# Patient Record
Sex: Male | Born: 1975 | Race: White | Hispanic: No | Marital: Single | Smoking: Former smoker
Health system: Southern US, Community
[De-identification: ages and names within clinical notes are randomized; demographics above are authoritative.]

## PROBLEM LIST (undated history)

## (undated) DIAGNOSIS — F319 Bipolar disorder, unspecified: Secondary | ICD-10-CM

## (undated) DIAGNOSIS — Z8489 Family history of other specified conditions: Secondary | ICD-10-CM

## (undated) DIAGNOSIS — I10 Essential (primary) hypertension: Secondary | ICD-10-CM

## (undated) DIAGNOSIS — F419 Anxiety disorder, unspecified: Secondary | ICD-10-CM

## (undated) HISTORY — PX: WISDOM TOOTH EXTRACTION: SHX21

## (undated) HISTORY — PX: WRIST SURGERY: SHX841

## (undated) HISTORY — PX: KNEE ARTHROSCOPY: SUR90

---

## 2003-06-07 ENCOUNTER — Emergency Department (HOSPITAL_COMMUNITY): Admission: EM | Admit: 2003-06-07 | Discharge: 2003-06-08 | Payer: Self-pay | Admitting: Emergency Medicine

## 2003-10-07 ENCOUNTER — Emergency Department (HOSPITAL_COMMUNITY): Admission: EM | Admit: 2003-10-07 | Discharge: 2003-10-08 | Payer: Self-pay | Admitting: Emergency Medicine

## 2004-08-30 ENCOUNTER — Emergency Department (HOSPITAL_COMMUNITY): Admission: EM | Admit: 2004-08-30 | Discharge: 2004-08-31 | Payer: Self-pay | Admitting: Emergency Medicine

## 2004-08-31 ENCOUNTER — Inpatient Hospital Stay (HOSPITAL_COMMUNITY): Admission: AD | Admit: 2004-08-31 | Discharge: 2004-08-31 | Payer: Self-pay | Admitting: Psychiatry

## 2004-08-31 ENCOUNTER — Ambulatory Visit: Payer: Self-pay | Admitting: Psychiatry

## 2004-09-01 ENCOUNTER — Inpatient Hospital Stay (HOSPITAL_COMMUNITY): Admission: EM | Admit: 2004-09-01 | Discharge: 2004-09-04 | Payer: Self-pay | Admitting: Emergency Medicine

## 2008-02-29 ENCOUNTER — Ambulatory Visit (HOSPITAL_BASED_OUTPATIENT_CLINIC_OR_DEPARTMENT_OTHER): Admission: RE | Admit: 2008-02-29 | Discharge: 2008-02-29 | Payer: Self-pay | Admitting: Orthopedic Surgery

## 2008-06-26 ENCOUNTER — Emergency Department (HOSPITAL_COMMUNITY): Admission: EM | Admit: 2008-06-26 | Discharge: 2008-06-26 | Payer: Self-pay | Admitting: Emergency Medicine

## 2010-06-30 ENCOUNTER — Emergency Department (HOSPITAL_COMMUNITY)
Admission: EM | Admit: 2010-06-30 | Discharge: 2010-06-30 | Disposition: A | Discharge status: Home / Self Care | Payer: Self-pay | Attending: Emergency Medicine | Admitting: Emergency Medicine

## 2010-06-30 DIAGNOSIS — I10 Essential (primary) hypertension: Secondary | ICD-10-CM | POA: Insufficient documentation

## 2010-06-30 DIAGNOSIS — M545 Low back pain, unspecified: Secondary | ICD-10-CM | POA: Insufficient documentation

## 2010-07-07 ENCOUNTER — Emergency Department (HOSPITAL_COMMUNITY)
Admission: EM | Admit: 2010-07-07 | Discharge: 2010-07-07 | Disposition: A | Discharge status: Home / Self Care | Payer: Self-pay | Attending: Emergency Medicine | Admitting: Emergency Medicine

## 2010-07-07 DIAGNOSIS — M79609 Pain in unspecified limb: Secondary | ICD-10-CM | POA: Insufficient documentation

## 2010-07-07 DIAGNOSIS — M545 Low back pain, unspecified: Secondary | ICD-10-CM | POA: Insufficient documentation

## 2010-07-07 DIAGNOSIS — I10 Essential (primary) hypertension: Secondary | ICD-10-CM | POA: Insufficient documentation

## 2010-07-07 DIAGNOSIS — M5430 Sciatica, unspecified side: Secondary | ICD-10-CM | POA: Insufficient documentation

## 2010-08-31 NOTE — Op Note (Signed)
Brady, [name]               ACCOUNT NO.:  [id number]   MEDICAL RECORD NO.:  [id number]          PATIENT TYPE:  AMB   LOCATION:  DSC                          FACILITY:  [facility]   PHYSICIAN:  Cindee Salt, M.D.       DATE OF BIRTH:  1975-08-14   DATE OF PROCEDURE:  02/29/2008  DATE OF DISCHARGE:                               OPERATIVE REPORT   PREOPERATIVE DIAGNOSIS:  Ulnocarpal abutment, right wrist.   POSTOPERATIVE DIAGNOSIS:  Ulnocarpal abutment, right wrist.   OPERATION:  Arthroscopy, right wrist with debridement of triangular  fibrocartilage complex tear, Feldman arthroplasty.   SURGEON:  Cindee Salt, MD   ASSISTANT:  Joaquin Courts, RN   ANESTHESIA:  General.   HISTORY:  The patient is a 35 year old male with a history of ulnar-  sided wrist pain, especially with twisting.  He has had an MRI done  revealing a TFCC tear, changes in his proximal ulnar lunate indicative  of an ulnocarpal abutment with a ulnar positive.  He has elected to  proceed with arthroscopy, debridement, Gibson Ramp arthroplasty, and  preference to a formal ulnar shortening osteotomy.  Questions have been  encouraged and answered.  He is aware of risks and complications  including incomplete relief of symptoms, possibility of infection,  recurrence of injury to arteries, nerves, tendons, dystrophy, and  possibility of requiring further surgical intervention, specifically  with an ulnar shortening osteotomy.  In the preoperative area, the  patient is seen, the extremity marked by both the patient and surgeon,  and antibiotic given.   PROCEDURE IN DETAIL:  The patient was brought to the operating room  where a general anesthetic was carried out without difficulty under the  direction of Dr. Gelene Mink.  He was prepped using DuraPrep, supine  position, right arm free.  A time-out was taken.  He was placed in the  ARC arthroscopy tower and 10 pounds of traction applied.  The joint was  inflated through the 3-4  portal.  A transverse incision was made through  skin only, deepened with a hemostat.  Blunt trocar was used to enter the  joint.  The joint was inspected.  The volar radial wrist ligaments were  intact.  Scapholunate ligament was intact.  The articular surface of the  distal radius, proximal carpal row was intact.  Lunotriquetral joint  showed no changes.  A central tear of the TFCC was noted.  This was  elliptical in nature with protuberance of the ulnar head with areas of  chondromalacia.  A irrigation catheter was placed in 6U, and 4-5 portal  was opened after localization with a 22 gauge needle.  A transverse  incision was made, deepened with a hemostat.  Blunt trocar was used to  enter the joint.  A full radius shaver was then introduced.  A  debridement of the triangle fibrocartilage complex was performed along  with further debridement with an Merton Border wand.  The articular cartilage  was removed from the distal ulna.  A bur was then used to bur the distal  ulna down to a level proximal to the ulnar  aspect of the distal radius.  This was done with full pronation and supination.  Image intensification  confirmed that the [facility] arthroplasty was adequate to relieve any  pressure with it being approximately a millimeter short off the distal  radius.  The midcarpal joint was then inflated through the radial  midcarpal portal.  A transverse incision was made, deepened with  hemostat.  Blunt trocar was used to enter the joint.  The joint was  inspected.  The STT joint showed no changes.  There were no changes in  the scapholunate ligament complex.  The ulnar side showed a type 2  lunate with no changes on the proximal aspect of the hamate.  The  articular surface of the midcarpal joint showed no changes.  The  arthroscope was removed.  The portals were then closed with interrupted  5-0 Vicryl Rapide sutures.  Sterile compressive dressing and volar  splint was applied.  The patient  tolerated the procedure well and was  taken to the recovery room for observation in satisfactory condition.  He will be discharged home to return to the [facility] [place] in  1 week on Vicodin.           ______________________________  Cindee Salt, M.D.     GK/MEDQ  D:  02/29/2008  T:  03/01/2008  Job:  [id number]   cc:   [facility]

## 2010-09-03 NOTE — Discharge Summary (Signed)
NAMEKAVIN, WECKWERTH               ACCOUNT NO.:  [id number]   MEDICAL RECORD NO.:  [id number]          PATIENT TYPE:  INP   LOCATION:  0368                         FACILITY:  [facility]   PHYSICIAN:  Jonna L. Robb Matar, M.D.DATE OF BIRTH:  05/08/1975   DATE OF ADMISSION:  08/31/2004  DATE OF DISCHARGE:  09/04/2004                                 DISCHARGE SUMMARY   FINAL DIAGNOSES:  1. Delirium tremens.  2. Psychosis.  3. Tobacco abuse  4. Hypertension.     PRIMARY CARE PHYSICIAN:  Unassigned.   CONSULTANTS:  Antonietta Breach, M.D., from psychiatry.   ALLERGIES:  None.   CODE STATUS:  Full.   HISTORY:  This 35 year old chronic alcohol was found to be hallucinating.  Eventually, the patient was taken into custody by police for taking the  parent's car without permission. The patient was taken to mental health, was  cleared and sent back to [facility]; however, over there, the patient  developed significant delirium, apparently had taken Flexeril and probably  some unknown other pills. The patient was transferred back to [facility] for admission.   Physical exam was unremarkable other than a slightly high blood pressure of  143/101. He was confused. Laboratory work was unremarkable. Blood screen  positive for benzodiazepines and tricyclics. Alcohol level was less than 5.   HOSPITAL COURSE:  The patient was put on an Ativan withdrawn protocol  because he continued to have episodes of hallucinations, he was seen in  consultation by Dr. Jeanie Sewer, and the patient was also given Risperdal on a  p.r.n. basis. By May 19, the patient was lucid, oriented, was planning on  doing AA with a sponsor. The patient is discharged on no medications.      JLB/MEDQ  D:  09/04/2004  T:  09/04/2004  Job:  [id number]

## 2010-09-03 NOTE — H&P (Signed)
**Note [name] Brady** [name], [name]               ACCOUNT NO.:  [id number]   MEDICAL RECORD NO.:  [id number]          PATIENT TYPE:  INP   LOCATION:  0101                         FACILITY:  [facility]   PHYSICIAN:  Michaelyn Barter, M.D. DATE OF BIRTH:  19-Dec-1975   DATE OF ADMISSION:  08/31/2004  DATE OF DISCHARGE:                                HISTORY & PHYSICAL   This is an [facility] admitting history and physical.  The  patient's primary care physician is unassigned.   CHIEF COMPLAINT:  Alcohol withdrawal/hallucinations.   HISTORY OF PRESENT ILLNESS:  [name] Brady is a 35 year old gentleman with a  past medical history of alcoholism who, according to his father, had been  noticed to be hallucinating this past Monday morning.  The patient  complained of having mice in his bed who ate his $100 bill.  The father  stated that were no mice present.  The patient himself called the police  later in the day complaining that he was seeing women in the house that were  not actually there.  The police came to investigate; however, later left.  Following this event, the patient took his parents' car without their  permission. His parents then called the police to report this incident.  Their car was found some distance away from their home and the patient was  taken into custody by the police.  The police took the patient to the mental  health facility and his parents went to have the patient committed.  The  patient subsequently was taken to mental health at approximately 7 p.m. on  Monday.  Mental health initially sent the patient to [name] to be  medically cleared and the patient was cleared yesterday at approximately 7  a.m. and sent back to [facility].  While at [facility], however, the patient developed significant delirium and was  transferred back to [facility] for further evaluation and  treatment.  The patient's father, [name] Brady, went on to  state that the  patient had taken his mother's muscle relaxant, cyclobenzaprine.  The bottle  initially contained 30 tablets but when he found the bottle, it was empty.  Likewise, he went on to state that the patient had purchased some unknown  pills on his own.   PAST MEDICAL HISTORY:  1.  Alcoholism.  2.  Delirium tremens which was treated at [facility] March 2005.   PAST SURGICAL HISTORY:  None.   ALLERGIES:  None.   HOME MEDICATIONS:  The patient takes no prescribed medications.   SOCIAL HISTORY:  Alcohol:  The patient started drinking at the age of 35.  The patient drinks at least 750 mL of vodka per day in the evening.  In the  morning he takes uppers to help him get going in the morning.  In addition,  he takes Jolt power drink also to help him have more energy in the morning.  According to the patient's father, he has been drinking very heavily for at  least a year.   Cigarettes:  Smokes one pack of cigarettes per day  and has been doing so  since the age of 35.   Street drugs:  According to the patient's father, he has tried a little bit  of everything in his younger years.   FAMILY HISTORY:  Mother has hypothyroidism.  Father has diabetes mellitus  and hypertension.   REVIEW OF SYSTEMS:  As per HPI.  Otherwise all review of systems are  negative.   PHYSICAL EXAMINATION:  GENERAL:  The patient is currently in wrist and hand  restraints.  Initially he was asleep when I went into the room.  However,  when I awakened him, initially he appeared to be somewhat confused but then  became more oriented, following commands.  He demonstrated no other signs of  distress.  He did not appear to be tachypneic or have any signs of  respiratory distress at all.  VITAL SIGNS:  Initially when he arrived in the emergency room, his  temperature was 97.8.  His blood pressure was 143/101, heart rate 125,  respirations 20.  O2 saturation was 96%.  HEENT:  Anicteric.  Pupils are  dilated, are sluggish to light.  Uvula is  midline.  No thrush.  NECK:  Supple.  No lymphadenopathy.  Thyroid not palpable.  CARDIAC: S1 and S2 present.  Regular rate and rhythm.  No S3. No S4.  LUNGS:  Clear bilaterally.  No crackles.  No wheezes.  ABDOMEN:  Soft, nontender, nondistended.  Positive bowel sounds.  No  organomegaly.  EXTREMITIES:  No leg edema.  NEUROLOGIC:  The patient is alert and oriented x3.  Currently he does not  appear to be hallucinating.  However, the patient just woke up and my  questions are pretty focused at this particular time and also limited.  MUSCULOSKELETAL:  Good grip strength bilaterally.  The patient is otherwise  again restrained and, therefore, my musculoskeletal and neurological exam  are somewhat limited because of this.   LABORATORY DATA:  The patient's white blood cell count is 7.5, hemoglobin  15, hematocrit 43.5, platelet count 205.  Sodium 137, potassium 4, chloride  102, CO2 27, glucose 88, BUN 15, creatinine 1, bilirubin total 1.2, alk phos  93.  SGOT 55, SGPT 39. Total protein 6.8, albumin 4.1, calcium 9.7.  The  patient's drug screen was positive for benzodiazepines. Tricyclics were  positive on Aug 30 2004.  Alcohol on Aug 30, 2004 was 219.  Alcohol on Aug 31, 2004 was less than 5.   ASSESSMENT/PLAN:  1.  Delirium.  This is most likely to be secondary to alcohol withdrawal.      According to the patient's father, it has been less than 24 hours since      the patient last consumed alcohol.  Therefore, again the patient more      than likely is in the window during which withdrawal would be more      likely.  Will treat the patient symptomatically for now.  Will start      alcohol withdrawal protocol.  Will also provide Librium on a p.r.n.      basis.   1.  Alcohol withdrawal:  Again will treat per alcohol withdrawal protocol.      Will provide thiamine, multivitamins, folic acid.  Will continue     restraints p.r.n. to prevent the  patient from hurting himself.  Will      also monitor for signs of delirium tremens.  The patient has had a      history of delirium tremens.  However, time frame from delirium tremens      currently is not appropriate in that it has only been approximately 24      hours since the patient did complete his last drink according to his      father.   1.  Gastrointestinal prophylaxis:  Will provide Protonix 40 mg daily.      OR/MEDQ  D:  09/01/2004  T:  09/01/2004  Job:  [id number]

## 2010-09-03 NOTE — Discharge Summary (Signed)
NAMEABDUR, [name] Brady               ACCOUNT NO.:  [id number]   MEDICAL RECORD NO.:  [id number]          PATIENT TYPE:  IPS   LOCATION:  0406                          FACILITY:  [facility]   PHYSICIAN:  Jeanice Lim, M.D. DATE OF BIRTH:  [birth year]   DATE OF ADMISSION:  08/31/2004  DATE OF DISCHARGE:  08/31/2004                                 DISCHARGE SUMMARY   IDENTIFYING DATA:  This is a 35 year old single Caucasian male involuntarily  admitted.  Petitioned by her mother for cocaine and alcohol and drug use.  Had been seeing people dressed like seat cushions and began breaking up  furniture.  Yesterday was seen in the emergency room and returned to mental  health, dysarthric speech, coarse tremor, visual hallucinations, poor  historian, delirium and drinking large amounts of alcohol daily and having  taken pain medicines unclear.   MEDICATIONS:  No known medications prescribed.   ALLERGIES:  No known drug allergies.   PHYSICAL EXAMINATION:  Positive nystagmus with poor ocular tracking and  dysarthria and ataxia.   MENTAL STATUS EXAM:  Fully alert, pleasant and cooperative, tremulous,  dysarthric, disorganized.  Unable to answer questions.  Mood depressed no  acute distress confused.  Cognitively impaired.  Judgment and insight  impaired.   ADMISSION DIAGNOSES:   AXIS I:  1.  Substance-induced psychosis.  2.  Rule out major depressive disorder.  3.  Possible delirium.  4.  Substance-related alcohol dependence.  5.  Possible withdrawal syndrome.  6.  Polysubstance abuse.   AXIS II:  None.   AXIS III:  Delirium secondary to alcohol withdrawal.   AXIS IV:  Moderate (stressors with limited support system and sequela of  substance abuse and mood).   AXIS V:  20/55.   HOSPITAL COURSE:  The patient was admitted and ordered routine p.r.n.  medications and underwent further monitoring.  Was encouraged to participate  in individual, group and milieu therapy.  Required Ativan  when getting on  the unit and then Librium detox protocol and CIWAs for safe withdrawal and  seizure precautions.  The patient was monitored and showed nystagmus and a  coarse tremor, confusion which became worse and worse, agitated, was trying  the door, trying to get out.  Pulse was elevated at 130 to 140 and patient  had received multiple Ativan, Librium 15 and 25 and had to be transferred to  emergency room for medical admission due to degree of confusion and delirium  and need for medical stabilization.   DISCHARGE DIAGNOSES:   AXIS I:  1.  Substance-induced psychosis.  2.  Rule out major depressive disorder.  3.  Possible delirium.  4.  Substance-related alcohol dependence.  5.  Possible withdrawal syndrome.  6.  Polysubstance abuse.   AXIS II:  None.   AXIS III:  Delirium secondary to alcohol withdrawal.   AXIS IV:  Moderate (stressors with limited support system and sequela of  substance abuse and mood).   AXIS V:  Global Assessment of Functioning on discharge 35-40.       JEM/MEDQ  D:  10/05/2004  T:  10/05/2004  Job:  [id number]

## 2011-02-22 ENCOUNTER — Encounter: Payer: Self-pay | Admitting: Emergency Medicine

## 2011-02-22 ENCOUNTER — Emergency Department (HOSPITAL_COMMUNITY)
Admission: EM | Admit: 2011-02-22 | Discharge: 2011-02-22 | Disposition: A | Discharge status: Home / Self Care | Payer: Self-pay | Attending: Emergency Medicine | Admitting: Emergency Medicine

## 2011-02-22 DIAGNOSIS — M549 Dorsalgia, unspecified: Secondary | ICD-10-CM | POA: Insufficient documentation

## 2011-02-22 DIAGNOSIS — F17200 Nicotine dependence, unspecified, uncomplicated: Secondary | ICD-10-CM | POA: Insufficient documentation

## 2011-02-22 DIAGNOSIS — IMO0001 Reserved for inherently not codable concepts without codable children: Secondary | ICD-10-CM | POA: Insufficient documentation

## 2011-02-22 DIAGNOSIS — M791 Myalgia, unspecified site: Secondary | ICD-10-CM

## 2011-02-22 DIAGNOSIS — M542 Cervicalgia: Secondary | ICD-10-CM | POA: Insufficient documentation

## 2011-02-22 HISTORY — DX: Essential (primary) hypertension: I10

## 2011-02-22 MED ORDER — IBUPROFEN 800 MG PO TABS: 800.0000 mg | ORAL_TABLET | Freq: Three times a day (TID) | ORAL | Status: AC

## 2011-02-22 MED ORDER — METHOCARBAMOL 500 MG PO TABS: 500.0000 mg | ORAL_TABLET | Freq: Two times a day (BID) | ORAL | Status: AC

## 2011-02-22 NOTE — ED Provider Notes (Signed)
History     CSN: [id number] Arrival date & time: 02/22/2011 10:38 AM   First MD Initiated Contact with Patient 02/22/11 1140      Chief Complaint  Patient presents with  . Back Pain    (Consider location/radiation/quality/duration/timing/severity/associated sxs/prior treatment) Patient is a 35 y.o. male presenting with back pain. No language interpreter was used.  Back Pain  This is a new problem. The current episode started 12 to 24 hours ago. The problem occurs constantly. The problem has not changed since onset.The pain is associated with no known injury. The quality of the pain is described as aching. The pain is at a severity of 5/10. The pain is moderate. The pain is the same all the time (stareted in the left neck right neck and back). Pertinent negatives include no chest pain, no fever, no numbness, no headaches, no leg pain, no paresthesias, no paresis, no tingling and no weakness. He has tried NSAIDs for the symptoms.    Past Medical History  Diagnosis Date  . Hypertension     History reviewed. No pertinent past surgical history.  No family history on file.  History  Substance Use Topics  . Smoking status: Current Everyday Smoker    Types: Cigarettes  . Smokeless tobacco: Not on file  . Alcohol Use: Yes      Review of Systems  Constitutional: Negative for fever.  Cardiovascular: Negative for chest pain.  Musculoskeletal: Positive for back pain.  Neurological: Negative for tingling, weakness, numbness, headaches and paresthesias.  All other systems reviewed and are negative.    Allergies  Review of patient's allergies indicates no known allergies.  Home Medications   Current Outpatient Rx  Name Route Sig Dispense Refill  . UNKNOWN TO PATIENT         BP 135/90  Pulse 63  Temp(Src) 98.3 F (36.8 C) (Oral)  Resp 20  Ht 5\' 11"  (1.803 m)  Wt 183 lb (83.008 kg)  BMI 25.52 kg/m2  SpO2 99%  Physical Exam  Constitutional: He appears well-developed  and well-nourished.  Eyes: Pupils are equal, round, and reactive to light.  Cardiovascular: Normal rate.   Musculoskeletal: He exhibits tenderness.       Bilateral neck pain that raditated to Lupper back.  Woke with pain yesterday  Skin: Skin is warm and dry.  Psychiatric: He has a normal mood and affect.    ED Course  Procedures (including critical care time)  Labs Reviewed - No data to display No results found.   No diagnosis found.    MDM          Jethro Bastos, NP 02/22/11 1324  Jethro Bastos, NP 02/22/11 1331

## 2011-02-22 NOTE — ED Provider Notes (Signed)
Medical screening examination/treatment/procedure(s) were performed by non-physician practitioner and as supervising physician I was immediately available for consultation/collaboration.   Gwyneth Sprout, MD 02/22/11 [phone]

## 2011-02-22 NOTE — ED Notes (Signed)
Pt states yesterday and began having neck and back pain and both signs of the neck

## 2012-08-27 ENCOUNTER — Encounter (HOSPITAL_COMMUNITY): Payer: Self-pay

## 2012-08-27 ENCOUNTER — Inpatient Hospital Stay (HOSPITAL_COMMUNITY)
Admission: EM | Admit: 2012-08-27 | Discharge: 2012-08-30 | DRG: 751 | Disposition: A | Discharge status: Home / Self Care | Payer: BC Managed Care – PPO | Attending: Internal Medicine | Admitting: Internal Medicine

## 2012-08-27 DIAGNOSIS — R45851 Suicidal ideations: Secondary | ICD-10-CM

## 2012-08-27 DIAGNOSIS — S93409A Sprain of unspecified ligament of unspecified ankle, initial encounter: Secondary | ICD-10-CM | POA: Diagnosis present

## 2012-08-27 DIAGNOSIS — F1910 Other psychoactive substance abuse, uncomplicated: Secondary | ICD-10-CM | POA: Diagnosis present

## 2012-08-27 DIAGNOSIS — F329 Major depressive disorder, single episode, unspecified: Secondary | ICD-10-CM | POA: Diagnosis present

## 2012-08-27 DIAGNOSIS — F411 Generalized anxiety disorder: Secondary | ICD-10-CM | POA: Diagnosis present

## 2012-08-27 DIAGNOSIS — F32A Depression, unspecified: Secondary | ICD-10-CM | POA: Diagnosis present

## 2012-08-27 DIAGNOSIS — F10239 Alcohol dependence with withdrawal, unspecified: Principal | ICD-10-CM | POA: Diagnosis present

## 2012-08-27 DIAGNOSIS — F10939 Alcohol use, unspecified with withdrawal, unspecified: Principal | ICD-10-CM | POA: Diagnosis present

## 2012-08-27 DIAGNOSIS — F988 Other specified behavioral and emotional disorders with onset usually occurring in childhood and adolescence: Secondary | ICD-10-CM | POA: Diagnosis present

## 2012-08-27 DIAGNOSIS — F17200 Nicotine dependence, unspecified, uncomplicated: Secondary | ICD-10-CM | POA: Diagnosis present

## 2012-08-27 DIAGNOSIS — F10229 Alcohol dependence with intoxication, unspecified: Secondary | ICD-10-CM | POA: Diagnosis present

## 2012-08-27 DIAGNOSIS — X58XXXA Exposure to other specified factors, initial encounter: Secondary | ICD-10-CM | POA: Diagnosis present

## 2012-08-27 DIAGNOSIS — I10 Essential (primary) hypertension: Secondary | ICD-10-CM | POA: Diagnosis present

## 2012-08-27 DIAGNOSIS — F3289 Other specified depressive episodes: Secondary | ICD-10-CM | POA: Diagnosis present

## 2012-08-27 DIAGNOSIS — F1020 Alcohol dependence, uncomplicated: Secondary | ICD-10-CM | POA: Diagnosis present

## 2012-08-27 HISTORY — DX: Anxiety disorder, unspecified: F41.9

## 2012-08-27 HISTORY — DX: Family history of other specified conditions: Z84.89

## 2012-08-27 LAB — URINALYSIS, ROUTINE W REFLEX MICROSCOPIC
Glucose, UA: NEGATIVE mg/dL
Hgb urine dipstick: NEGATIVE
Ketones, ur: NEGATIVE mg/dL
Protein, ur: NEGATIVE mg/dL

## 2012-08-27 LAB — RAPID URINE DRUG SCREEN, HOSP PERFORMED
Benzodiazepines: NOT DETECTED
Cocaine: NOT DETECTED
Tetrahydrocannabinol: NOT DETECTED

## 2012-08-27 LAB — MAGNESIUM: Magnesium: 2.2 mg/dL (ref 1.5–2.5)

## 2012-08-27 LAB — COMPREHENSIVE METABOLIC PANEL
BUN: 9 mg/dL (ref 6–23)
GFR calc non Af Amer: >90 mL/min (ref >90)
Glucose, Bld: 91 mg/dL (ref 70–99)
Total Bilirubin: 0.4 mg/dL (ref 0.3–1.2)
Total Protein: 6.7 g/dL (ref 6.0–8.3)

## 2012-08-27 LAB — CBC WITH DIFFERENTIAL/PLATELET
Basophils Absolute: 0 10*3/uL (ref 0.0–0.1)
Basophils Relative: 1 % (ref 0–1)
Eosinophils Absolute: 0.1 10*3/uL (ref 0.0–0.7)
Eosinophils Relative: 1 % (ref 0–5)
HCT: 39.6 % (ref 39.0–52.0)
Hemoglobin: 13.8 g/dL (ref 13.0–17.0)
Lymphs Abs: 2.5 10*3/uL (ref 0.7–4.0)
MCH: 35 pg — ABNORMAL HIGH (ref 26.0–34.0)
MCHC: 34.8 g/dL (ref 30.0–36.0)
MCV: 100.5 fL — ABNORMAL HIGH (ref 78.0–100.0)
Neutro Abs: 3.9 10*3/uL (ref 1.7–7.7)
RDW: 13.8 % (ref 11.5–15.5)
WBC: 7.1 10*3/uL (ref 4.0–10.5)

## 2012-08-27 LAB — PHOSPHORUS: Phosphorus: 3.6 mg/dL (ref 2.3–4.6)

## 2012-08-27 LAB — ETHANOL: Alcohol, Ethyl (B): 239 mg/dL — ABNORMAL HIGH (ref 0–11)

## 2012-08-27 MED ORDER — ADULT MULTIVITAMIN W/MINERALS CH
1.0000 | ORAL_TABLET | Freq: Every day | ORAL | Status: DC
  Administered 2012-08-28 – 2012-08-30 (×4): 1 via ORAL
  Filled 2012-08-27 (×4): qty 1

## 2012-08-27 MED ORDER — SODIUM CHLORIDE 0.9 % IV BOLUS (SEPSIS)
1000.0000 mL | Freq: Once | INTRAVENOUS | Status: AC
  Administered 2012-08-27: 1000 mL via INTRAVENOUS

## 2012-08-27 MED ORDER — LORAZEPAM 2 MG/ML IJ SOLN
0.0000 mg | Freq: Four times a day (QID) | INTRAMUSCULAR | Status: AC
  Administered 2012-08-28: 2 mg via INTRAVENOUS
  Administered 2012-08-28: 1 mg via INTRAVENOUS
  Administered 2012-08-28 – 2012-08-29 (×3): 2 mg via INTRAVENOUS
  Filled 2012-08-27 (×6): qty 1

## 2012-08-27 MED ORDER — FOLIC ACID 1 MG PO TABS
1.0000 mg | ORAL_TABLET | Freq: Every day | ORAL | Status: DC
  Administered 2012-08-28 – 2012-08-30 (×4): 1 mg via ORAL
  Filled 2012-08-27 (×4): qty 1

## 2012-08-27 MED ORDER — VITAMIN B-1 100 MG PO TABS
100.0000 mg | ORAL_TABLET | Freq: Every day | ORAL | Status: DC
  Administered 2012-08-28 – 2012-08-30 (×3): 100 mg via ORAL
  Filled 2012-08-27 (×3): qty 1

## 2012-08-27 MED ORDER — THIAMINE HCL 100 MG/ML IJ SOLN
100.0000 mg | Freq: Every day | INTRAMUSCULAR | Status: DC
  Administered 2012-08-27: 100 mg via INTRAVENOUS
  Filled 2012-08-27 (×3): qty 1
  Filled 2012-08-27: qty 2

## 2012-08-27 MED ORDER — LORAZEPAM 2 MG/ML IJ SOLN
2.0000 mg | Freq: Once | INTRAMUSCULAR | Status: AC
  Administered 2012-08-27: 2 mg via INTRAVENOUS
  Filled 2012-08-27: qty 1

## 2012-08-27 MED ORDER — LORAZEPAM 2 MG/ML IJ SOLN: 1.0000 mg | Freq: Four times a day (QID) | INTRAMUSCULAR | Status: DC | PRN

## 2012-08-27 MED ORDER — LORAZEPAM 0.5 MG PO TABS: 1.0000 mg | ORAL_TABLET | Freq: Four times a day (QID) | ORAL | Status: DC | PRN

## 2012-08-27 MED ORDER — NICOTINE 21 MG/24HR TD PT24
21.0000 mg | MEDICATED_PATCH | Freq: Once | TRANSDERMAL | Status: AC
  Administered 2012-08-28: 21 mg via TRANSDERMAL
  Filled 2012-08-27: qty 1

## 2012-08-27 MED ORDER — LORAZEPAM 2 MG/ML IJ SOLN
0.0000 mg | Freq: Two times a day (BID) | INTRAMUSCULAR | Status: DC
  Administered 2012-08-29: 2 mg via INTRAVENOUS
  Filled 2012-08-27: qty 1

## 2012-08-27 NOTE — ED Provider Notes (Signed)
History  This chart was scribed for [employer]. Rubin Payor, MD by Ardeen Jourdain, ED Scribe. This patient was seen in room TR04C/TR04C and the patient's care was started at 1903.  CSN: [id number]  Arrival date & time 08/27/12  1509   First MD Initiated Contact with Patient 08/27/12 1903      Chief Complaint  Patient presents with  . detox      The history is provided by the patient. No language interpreter was used.   HPI Comments: [name] Brady is a 37 y.o. male who presents to the Emergency Department complaining of needing detox from ETOH. He states he has been treated 3-4 times with the last prolonged sobriety in 2011. He states he drinks a gallon of vodka a day. He states his last drink was at 1330. He states he has had seizures in the past from withdrawal symptoms. He states he has been hospitalized in the past for withdrawal. He states he has been an alcoholic for 20 years. He denies taking any other substances habitually. He states he takes his medications as prescribed. He states he has not been able to sleep due to the alcoholism.   Past Medical History  Diagnosis Date  . Hypertension   . Anxiety     History reviewed. No pertinent past surgical history.  History reviewed. No pertinent family history.  History  Substance Use Topics  . Smoking status: Current Every Day Smoker    Types: Cigarettes  . Smokeless tobacco: Not on file  . Alcohol Use: Yes      Review of Systems  HENT: Positive for neck pain.   Musculoskeletal: Positive for back pain and arthralgias.  All other systems reviewed and are negative.    Allergies  Review of patient's allergies indicates no known allergies.  Home Medications   Current Outpatient Rx  Name  Route  Sig  Dispense  Refill  . ALPRAZolam (XANAX) 0.25 MG tablet   Oral   Take 0.25 mg by mouth 2 (two) times daily as needed for sleep.         Marland Kitchen amphetamine-dextroamphetamine (ADDERALL XR) 30 MG 24 hr capsule   Oral   Take 30  mg by mouth every morning.         Marland Kitchen atenolol (TENORMIN) 50 MG tablet   Oral   Take 50 mg by mouth daily.         Marland Kitchen buPROPion (WELLBUTRIN SR) 150 MG 12 hr tablet   Oral   Take 150 mg by mouth 2 (two) times daily.         Marland Kitchen FLUoxetine HCl (PROZAC PO)   Oral   Take 10 mg by mouth 2 (two) times daily.            Triage Vitals: BP 108/80  Pulse 74  Temp(Src) 98.3 F (36.8 C) (Oral)  Resp 14  SpO2 97%  Physical Exam  Nursing note and vitals reviewed. Constitutional: He is oriented to person, place, and time. He appears well-developed and well-nourished. No distress.  HENT:  Head: Normocephalic and atraumatic.  Eyes: EOM are normal. Pupils are equal, round, and reactive to light.  Pupils mildly dilated.   Neck: Normal range of motion. Neck supple. No tracheal deviation present.  Cardiovascular: Normal rate, regular rhythm and normal heart sounds.  Exam reveals no gallop and no friction rub.   No murmur heard. Pulmonary/Chest: Effort normal. No respiratory distress. He has no wheezes. He has no rales. He exhibits no tenderness.  Mildly harsh   Abdominal: Soft. He exhibits no distension.  Musculoskeletal: Normal range of motion. He exhibits no edema.  Neurological: He is alert and oriented to person, place, and time.  Skin: Skin is warm and dry. He is not diaphoretic.  Psychiatric: He has a normal mood and affect. His behavior is normal.  Anxious     ED Course  Procedures (including critical care time)  7:20 PM-Discussed treatment plan which includes CBC, CMP, ETOH, urine rapid drug screen and UA with pt at bedside and pt agreed to plan.    Labs Reviewed  CBC WITH DIFFERENTIAL - Abnormal; Notable for the following:    RBC 3.94 (*)    MCV 100.5 (*)    MCH 35.0 (*)    All other components within normal limits  COMPREHENSIVE METABOLIC PANEL - Abnormal; Notable for the following:    AST 40 (*)    All other components within normal limits  ETHANOL - Abnormal;  Notable for the following:    Alcohol, Ethyl (B) 239 (*)    All other components within normal limits  URINE RAPID DRUG SCREEN (HOSP PERFORMED)  URINALYSIS, ROUTINE W REFLEX MICROSCOPIC  MAGNESIUM  PHOSPHORUS   No results found.   1. Alcohol withdrawal   2. Depression       MDM  Patient is requesting detox off alcohol. He states that he uses about a gallon of vodka a day. He last drank the way to the hospital 4 hours ago. He denies other substance abuse. He denies suicidal thoughts may be told the nurse study has had some. His mother also apparently told the nurse that she came here because he stated that he was suicidal. I would not let him be discharged now. he'll be admitted to internal medicine for treatment of potentially severe withdrawal      I personally performed the services described in this documentation, which was scribed in my presence. The recorded information has been reviewed and is accurate.     Juliet Rude. Rubin Payor, MD 08/27/12 2022

## 2012-08-27 NOTE — ED Notes (Signed)
Pt requesting detox from ETOH, pt reports drinking a 5th of Voda daily x20 years, pt reports he last drank 1330 today, states "about a 1/2 fifth." pt denies using illegal drugs, HI, or SI

## 2012-08-27 NOTE — ED Notes (Signed)
Pt did not answer when called  x2 

## 2012-08-27 NOTE — H&P (Signed)
[facility] History and Physical  Ohm Dentler ZOX:[id number] DOB: [birth year]    PCP:  None  Chief Complaint: depressed, suicidal, drinking too much.  HPI: [name] Brady is an 37 y.o. male with hx of alcoholism, polysubstance abuse, HTN, GAD, ADD, presents to the ER having increased depression, having suicidal ideation but no plan, malaise, and feeling very anxious.  Normally he takes low dose xanax BID, and drink at least 1.75 Liter of liquor a day ?!!  He denied any chest pain or shortness of breath.  Evaluation in the ER included no leukocytosis, normal Hb, normal renal Fx tests and LFTs.  His MCV is 100.5.  His alcohol level is 239, and he has negative urine drug screen.  He was felt to be in very mild withdrawal, but potentially could have more withdrawal, so hospitalist was asked to admit him instead of inpatient psych treatment at this time.  Rewiew of Systems:  Constitutional: Negative for malaise, fever and chills. No significant weight loss or weight gain Eyes: Negative for eye pain, redness and discharge, diplopia, visual changes, or flashes of light. ENMT: Negative for ear pain, hoarseness, nasal congestion, sinus pressure and sore throat. No headaches; tinnitus, drooling, or problem swallowing. Cardiovascular: Negative for chest pain, palpitations, diaphoresis, dyspnea and peripheral edema. ; No orthopnea, PND Respiratory: Negative for cough, hemoptysis, wheezing and stridor. No pleuritic chestpain. Gastrointestinal: Negative for nausea, vomiting, diarrhea, constipation, abdominal pain, melena, blood in stool, hematemesis, jaundice and rectal bleeding.    Genitourinary: Negative for frequency, dysuria, incontinence,flank pain and hematuria; Musculoskeletal: Negative for back pain and neck pain. Negative for swelling and trauma.;  Skin: . Negative for pruritus, rash, abrasions, bruising and skin lesion.; ulcerations Neuro: Negative for headache, lightheadedness and neck  stiffness. Negative for weakness, altered level of consciousness , altered mental status, extremity weakness, burning feet, involuntary movement, seizure and syncope.  Psych: negative for insomnia, tearfulness, panic attacks, hallucinations.   Past Medical History  Diagnosis Date  . Hypertension   . Anxiety     History reviewed. No pertinent past surgical history.  Medications:  HOME MEDS: Prior to Admission medications   Medication Sig Start Date End Date Taking? Authorizing Provider  ALPRAZolam (XANAX) 0.25 MG tablet Take 0.25 mg by mouth 2 (two) times daily as needed for sleep.   Yes Historical Provider, MD  amphetamine-dextroamphetamine (ADDERALL XR) 30 MG 24 hr capsule Take 30 mg by mouth every morning.   Yes Historical Provider, MD  atenolol (TENORMIN) 50 MG tablet Take 50 mg by mouth daily.   Yes Historical Provider, MD  buPROPion (WELLBUTRIN SR) 150 MG 12 hr tablet Take 150 mg by mouth 2 (two) times daily.   Yes Historical Provider, MD  FLUoxetine HCl (PROZAC PO) Take 10 mg by mouth 2 (two) times daily.    Yes Historical Provider, MD     Allergies:  No Known Allergies  Social History:   reports that he has been smoking Cigarettes.  He has been smoking about 0.00 packs per day. He does not have any smokeless tobacco history on file. He reports that  drinks alcohol. He reports that he does not use illicit drugs.  Family History: History reviewed. No pertinent family history.   Physical Exam: Filed Vitals:   08/27/12 1526  BP: 108/80  Pulse: 74  Temp: 98.3 F (36.8 C)  TempSrc: Oral  Resp: 14  SpO2: 97%   Blood pressure 108/80, pulse 74, temperature 98.3 F (36.8 C), temperature source Oral, resp. rate 14, SpO2  97.00%.  GEN:  Pleasant patient lying in the stretcher in no acute distress; cooperative with exam. PSYCH:  alert and oriented x4; does not appear anxious or depressed; affect is appropriate. HEENT: Mucous membranes pink and anicteric; PERRLA; EOM  intact; no cervical lymphadenopathy nor thyromegaly or carotid bruit; no JVD; There were no stridor. Neck is very supple. Breasts:: Not examined CHEST WALL: No tenderness CHEST: Normal respiration, clear to auscultation bilaterally.  HEART: Regular rate and rhythm.  There are no murmur, rub, or gallops.   BACK: No kyphosis or scoliosis; no CVA tenderness ABDOMEN: soft and non-tender; no masses, no organomegaly, normal abdominal bowel sounds; no pannus; no intertriginous candida. There is no rebound and no distention. Rectal Exam: Not done EXTREMITIES: No bone or joint deformity; age-appropriate arthropathy of the hands and knees; no edema; no ulcerations.  There is no calf tenderness. Genitalia: not examined PULSES: 2+ and symmetric SKIN: Normal hydration no rash or ulceration. Numerous tatoo. CNS: Cranial nerves 2-12 grossly intact no focal lateralizing neurologic deficit.  Speech is fluent; uvula elevated with phonation, facial symmetry and tongue midline. DTR are normal bilaterally, cerebella exam is intact, barbinski is negative and strengths are equaled bilaterally.  No sensory loss.   Labs on Admission:  Basic Metabolic Panel:  Recent Labs Lab 08/27/12 1534 08/27/12 1955  NA 139  --   K 3.7  --   CL 102  --   CO2 31  --   GLUCOSE 91  --   BUN 9  --   CREATININE 0.90  --   CALCIUM 9.4  --   MG  --  2.2  PHOS  --  3.6   Liver Function Tests:  Recent Labs Lab 08/27/12 1534  AST 40*  ALT 31  ALKPHOS 84  BILITOT 0.4  PROT 6.7  ALBUMIN 3.8   No results found for this basename: LIPASE, AMYLASE,  in the last 168 hours No results found for this basename: AMMONIA,  in the last 168 hours CBC:  Recent Labs Lab 08/27/12 1534  WBC 7.1  NEUTROABS 3.9  HGB 13.8  HCT 39.6  MCV 100.5*  PLT 268   Cardiac Enzymes: No results found for this basename: CKTOTAL, CKMB, CKMBINDEX, TROPONINI,  in the last 168 hours  CBG: No results found for this basename: GLUCAP,  in the  last 168 hours   Radiological Exams on Admission: No results found.  Assessment/Plan Present on Admission:  . Alcohol withdrawal . GAD (generalized anxiety disorder) . Alcoholism, chronic . Depression . HTN (hypertension) . ADD (attention deficit disorder)  PLAN:  I will admit him to telemetry for very mild withdrawal with potential need for more benzodiazepines.  Will place on CIWA protocol with ativan along MVI and IVF.  Please consult psychiatry in the am for suicidal ideation and detox facility.  I have continued his meds including betablocker, Adderal, Prozac and Welbutrin at home dose.  He is stable, full code, and will be admitted to [facility] with sitter tonight.  Thank you for allowing me to partake in the care of your nice patient.  Other plans as per orders.  Code Status: FULL Unk Lightning, MD. [facility] Pager [phone] 7pm to 7am.  08/27/2012, 10:33 PM

## 2012-08-27 NOTE — ED Notes (Signed)
Le, MD at bedside.  

## 2012-08-28 ENCOUNTER — Encounter (HOSPITAL_COMMUNITY): Payer: Self-pay | Admitting: General Practice

## 2012-08-28 DIAGNOSIS — R45851 Suicidal ideations: Secondary | ICD-10-CM

## 2012-08-28 DIAGNOSIS — F1994 Other psychoactive substance use, unspecified with psychoactive substance-induced mood disorder: Secondary | ICD-10-CM

## 2012-08-28 DIAGNOSIS — I10 Essential (primary) hypertension: Secondary | ICD-10-CM

## 2012-08-28 DIAGNOSIS — F329 Major depressive disorder, single episode, unspecified: Secondary | ICD-10-CM

## 2012-08-28 LAB — CBC
HCT: 37.1 % — ABNORMAL LOW (ref 39.0–52.0)
Hemoglobin: 13.1 g/dL (ref 13.0–17.0)
MCH: 35.5 pg — ABNORMAL HIGH (ref 26.0–34.0)
MCHC: 35.3 g/dL (ref 30.0–36.0)

## 2012-08-28 LAB — CREATININE, SERUM: GFR calc non Af Amer: >90 mL/min (ref >90)

## 2012-08-28 MED ORDER — IBUPROFEN 400 MG PO TABS
400.0000 mg | ORAL_TABLET | Freq: Four times a day (QID) | ORAL | Status: DC | PRN
  Administered 2012-08-28 (×2): 400 mg via ORAL
  Filled 2012-08-28 (×3): qty 1

## 2012-08-28 MED ORDER — DIAZEPAM 5 MG PO TABS
5.0000 mg | ORAL_TABLET | Freq: Three times a day (TID) | ORAL | Status: DC
  Administered 2012-08-28 – 2012-08-29 (×3): 5 mg via ORAL
  Filled 2012-08-28 (×3): qty 1

## 2012-08-28 MED ORDER — ATENOLOL 50 MG PO TABS
50.0000 mg | ORAL_TABLET | Freq: Every day | ORAL | Status: DC
  Administered 2012-08-28 – 2012-08-30 (×3): 50 mg via ORAL
  Filled 2012-08-28 (×3): qty 1

## 2012-08-28 MED ORDER — PNEUMOCOCCAL VAC POLYVALENT 25 MCG/0.5ML IJ INJ
0.5000 mL | INJECTION | INTRAMUSCULAR | Status: AC
  Filled 2012-08-28: qty 0.5

## 2012-08-28 MED ORDER — SODIUM CHLORIDE 0.9 % IJ SOLN
3.0000 mL | Freq: Two times a day (BID) | INTRAMUSCULAR | Status: DC
  Administered 2012-08-28 – 2012-08-29 (×5): 3 mL via INTRAVENOUS

## 2012-08-28 MED ORDER — ONDANSETRON HCL 4 MG PO TABS: 4.0000 mg | ORAL_TABLET | Freq: Four times a day (QID) | ORAL | Status: DC | PRN

## 2012-08-28 MED ORDER — BUPROPION HCL ER (SR) 150 MG PO TB12
150.0000 mg | ORAL_TABLET | Freq: Two times a day (BID) | ORAL | Status: DC
  Administered 2012-08-28 (×2): 150 mg via ORAL
  Filled 2012-08-28 (×3): qty 1

## 2012-08-28 MED ORDER — FLUOXETINE HCL 20 MG PO CAPS
40.0000 mg | ORAL_CAPSULE | Freq: Every day | ORAL | Status: DC
  Administered 2012-08-29 – 2012-08-30 (×2): 40 mg via ORAL
  Filled 2012-08-28 (×2): qty 2

## 2012-08-28 MED ORDER — FLUOXETINE HCL 20 MG PO CAPS
20.0000 mg | ORAL_CAPSULE | Freq: Every day | ORAL | Status: DC
  Administered 2012-08-28: 20 mg via ORAL
  Filled 2012-08-28: qty 1

## 2012-08-28 MED ORDER — AMPHETAMINE-DEXTROAMPHET ER 10 MG PO CP24
30.0000 mg | ORAL_CAPSULE | Freq: Every morning | ORAL | Status: DC
  Administered 2012-08-28: 30 mg via ORAL
  Filled 2012-08-28: qty 3

## 2012-08-28 MED ORDER — ENOXAPARIN SODIUM 40 MG/0.4ML ~~LOC~~ SOLN
40.0000 mg | SUBCUTANEOUS | Status: DC
  Administered 2012-08-28 – 2012-08-29 (×2): 40 mg via SUBCUTANEOUS
  Filled 2012-08-28 (×3): qty 0.4

## 2012-08-28 MED ORDER — HYDRALAZINE HCL 25 MG PO TABS
25.0000 mg | ORAL_TABLET | Freq: Four times a day (QID) | ORAL | Status: DC | PRN
  Administered 2012-08-28: 25 mg via ORAL
  Filled 2012-08-28 (×2): qty 1

## 2012-08-28 MED ORDER — ONDANSETRON HCL 4 MG/2ML IJ SOLN: 4.0000 mg | Freq: Four times a day (QID) | INTRAMUSCULAR | Status: DC | PRN

## 2012-08-28 MED ORDER — DOCUSATE SODIUM 100 MG PO CAPS
100.0000 mg | ORAL_CAPSULE | Freq: Two times a day (BID) | ORAL | Status: DC
  Administered 2012-08-28 – 2012-08-30 (×5): 100 mg via ORAL
  Filled 2012-08-28 (×7): qty 1

## 2012-08-28 NOTE — Consult Note (Signed)
Reason for Consult: depression and SI; substance abuse Referring Physician: Dr. Waneta Martins [name] Brady is an 37 y.o. male.  HPI: Patient was seen and chart reviewed. Patient stated that he was intoxicated with alcohol before he was admitted to the [facility]. Patient has long history of hx of alcoholism, polysubstance abuse, HTN, GAD, and ADD. Patient has been receiving outpatient psychiatric services from [name], NP. Patient was not a bit of any suicidal ideations intentions or plans. Patient reported he has been more depressed and anxious since that his father was diagnosed with a cancer and in and out of the hospitals. Patient stated he is the oldest of 4 siblings and closest to his parent's geographically. Patient stated he was not able to walk as much as used to work before which caused a financial problems depression anxiety and insomnia. Patient reported he started drinking alcohol to cope with his symptoms of depression and anxiety associated stress from his family had problems.patient stated that he does not take medication when he was drinking. Case discussed with alcohol along with the Wellbutrin and patient agreed to change as advised.  Patient stated he has been drinking 1/5 vodka a day and some beer. His alcohol level is 239, and he has negative urine drug screen. He was felt to be in very mild withdrawal, but potentially could have more withdrawal. Patient has a previous history of fall substance abuse detox and rehabilitation services at [facility] in 2007, 2011 and again in 2012. Patient reported he has been discussing with his mother regarding long-term inpatient substance abuse treatment program at [place], [place]. He has no history of suicidal attempt and he has no family history of suicidal attempt. He has no previous acute psychiatric hospitalization.   Mental Status Examination: Patient is awake and alert and oriented x4, dressed in hospital scrubsand  fairly groomed, has multiple colorful tattoos on both upper extremities,and maintaining good eye contact. Patient has depressed and anxious mood and his affect was at appropriate and congruent. He has normal speech but more talkative. His thought process is linear and goal directed. Patient has denied suicidal, homicidal ideations, intentions or plans. Patient has no evidence of auditory or visual hallucinations, delusions, and paranoia. Patient has fair insight judgment and impulse control.   Past Medical History  Diagnosis Date  . Hypertension   . Anxiety   . Family history of anesthesia complication     difficult for father to wake up"    Past Surgical History  Procedure Laterality Date  . Knee arthroscopy Right   . Wrist surgery    . Wisdom tooth extraction      History reviewed. No pertinent family history.  Social History:  reports that he has been smoking Cigarettes.  He has a 18 pack-year smoking history. He has never used smokeless tobacco. He reports that  drinks alcohol. He reports that he uses illicit drugs (Marijuana, Heroin, and Cocaine).  Allergies: No Known Allergies  Medications: I have reviewed the patient's current medications.  Results for orders placed during the hospital encounter of 08/27/12 (from the past 48 hour(s))  CBC WITH DIFFERENTIAL     Status: Abnormal   Collection Time    08/27/12  3:34 PM      Result Value Range   WBC 7.1  4.0 - 10.5 K/uL   RBC 3.94 (*) 4.22 - 5.81 MIL/uL   Hemoglobin 13.8  13.0 - 17.0 g/dL   HCT 16.1  09.6 - 04.5 %  MCV 100.5 (*) 78.0 - 100.0 fL   MCH 35.0 (*) 26.0 - 34.0 pg   MCHC 34.8  30.0 - 36.0 g/dL   RDW 45.4  09.8 - 11.9 %   Platelets 268  150 - 400 K/uL   Neutrophils Relative 55  43 - 77 %   Neutro Abs 3.9  1.7 - 7.7 K/uL   Lymphocytes Relative 36  12 - 46 %   Lymphs Abs 2.5  0.7 - 4.0 K/uL   Monocytes Relative 7  3 - 12 %   Monocytes Absolute 0.5  0.1 - 1.0 K/uL   Eosinophils Relative 1  0 - 5 %   Eosinophils  Absolute 0.1  0.0 - 0.7 K/uL   Basophils Relative 1  0 - 1 %   Basophils Absolute 0.0  0.0 - 0.1 K/uL  COMPREHENSIVE METABOLIC PANEL     Status: Abnormal   Collection Time    08/27/12  3:34 PM      Result Value Range   Sodium 139  135 - 145 mEq/L   Potassium 3.7  3.5 - 5.1 mEq/L   Chloride 102  96 - 112 mEq/L   CO2 31  19 - 32 mEq/L   Glucose, Bld 91  70 - 99 mg/dL   BUN 9  6 - 23 mg/dL   Creatinine, Ser 1.47  0.50 - 1.35 mg/dL   Calcium 9.4  8.4 - 82.9 mg/dL   Total Protein 6.7  6.0 - 8.3 g/dL   Albumin 3.8  3.5 - 5.2 g/dL   AST 40 (*) 0 - 37 U/L   ALT 31  0 - 53 U/L   Alkaline Phosphatase 84  39 - 117 U/L   Total Bilirubin 0.4  0.3 - 1.2 mg/dL   GFR calc non Af Amer >90  >90 mL/min   GFR calc Af Amer >90  >90 mL/min   Comment:            The eGFR has been calculated     using the CKD EPI equation.     This calculation has not been     validated in all clinical     situations.     eGFR's persistently     <90 mL/min signify     possible Chronic Kidney Disease.  ETHANOL     Status: Abnormal   Collection Time    08/27/12  3:34 PM      Result Value Range   Alcohol, Ethyl (B) 239 (*) 0 - 11 mg/dL   Comment:            LOWEST DETECTABLE LIMIT FOR     SERUM ALCOHOL IS 11 mg/dL     FOR MEDICAL PURPOSES ONLY  URINE RAPID DRUG SCREEN (HOSP PERFORMED)     Status: None   Collection Time    08/27/12  5:14 PM      Result Value Range   Opiates NONE DETECTED  NONE DETECTED   Cocaine NONE DETECTED  NONE DETECTED   Benzodiazepines NONE DETECTED  NONE DETECTED   Amphetamines NONE DETECTED  NONE DETECTED   Tetrahydrocannabinol NONE DETECTED  NONE DETECTED   Barbiturates NONE DETECTED  NONE DETECTED   Comment:            DRUG SCREEN FOR MEDICAL PURPOSES     ONLY.  IF CONFIRMATION IS NEEDED     FOR ANY PURPOSE, NOTIFY LAB     WITHIN 5 DAYS.  LOWEST DETECTABLE LIMITS     FOR URINE DRUG SCREEN     Drug Class       Cutoff (ng/mL)     Amphetamine      1000      Barbiturate      200     Benzodiazepine   200     Tricyclics       300     Opiates          300     Cocaine          300     THC              50  URINALYSIS, ROUTINE W REFLEX MICROSCOPIC     Status: None   Collection Time    08/27/12  5:14 PM      Result Value Range   Color, Urine YELLOW  YELLOW   APPearance CLEAR  CLEAR   Specific Gravity, Urine 1.006  1.005 - 1.030   pH 7.0  5.0 - 8.0   Glucose, UA NEGATIVE  NEGATIVE mg/dL   Hgb urine dipstick NEGATIVE  NEGATIVE   Bilirubin Urine NEGATIVE  NEGATIVE   Ketones, ur NEGATIVE  NEGATIVE mg/dL   Protein, ur NEGATIVE  NEGATIVE mg/dL   Urobilinogen, UA 0.2  0.0 - 1.0 mg/dL   Nitrite NEGATIVE  NEGATIVE   Leukocytes, UA NEGATIVE  NEGATIVE   Comment: MICROSCOPIC NOT DONE ON URINES WITH NEGATIVE PROTEIN, BLOOD, LEUKOCYTES, NITRITE, OR GLUCOSE <1000 mg/dL.  MAGNESIUM     Status: None   Collection Time    08/27/12  7:55 PM      Result Value Range   Magnesium 2.2  1.5 - 2.5 mg/dL  PHOSPHORUS     Status: None   Collection Time    08/27/12  7:55 PM      Result Value Range   Phosphorus 3.6  2.3 - 4.6 mg/dL  CBC     Status: Abnormal   Collection Time    08/28/12 12:59 AM      Result Value Range   WBC 6.9  4.0 - 10.5 K/uL   RBC 3.69 (*) 4.22 - 5.81 MIL/uL   Hemoglobin 13.1  13.0 - 17.0 g/dL   HCT 78.2 (*) 95.6 - 21.3 %   MCV 100.5 (*) 78.0 - 100.0 fL   MCH 35.5 (*) 26.0 - 34.0 pg   MCHC 35.3  30.0 - 36.0 g/dL   RDW 08.6  57.8 - 46.9 %   Platelets 250  150 - 400 K/uL  CREATININE, SERUM     Status: None   Collection Time    08/28/12 12:59 AM      Result Value Range   Creatinine, Ser 0.84  0.50 - 1.35 mg/dL   GFR calc non Af Amer >90  >90 mL/min   GFR calc Af Amer >90  >90 mL/min   Comment:            The eGFR has been calculated     using the CKD EPI equation.     This calculation has not been     validated in all clinical     situations.     eGFR's persistently     <90 mL/min signify     possible Chronic Kidney Disease.     No results found.  Positive for anxiety, bad mood, depression, excessive alcohol consumption and sleep disturbance Blood pressure 150/99, pulse 70, temperature 98.2 F (36.8 C), temperature source Oral,  resp. rate 16, height 5\' 11"  (1.803 m), weight 169 lb 4.8 oz (76.794 kg), SpO2 97.00%.   Assessment/Plan: Substance induced mood disorder Alcohol dependence vs withdrawal   Recommendation: Will increase Fluoxetine to 40 mg QD and discontinue Wellbutrin due to may lower seizure threshold when drinks while taking the medication.  Patient does not meet criteria for acute psychiatric hospitalization. Recommend to continue alcohol detox treatment and may discharge suicidal watch and sitte. He is contracts for safety. He and his mother have plans for long term substance abuse treatment program at [place], [place] as a aftercare plan. Appreciate psychiatric consultation and follow up as clinically needed. Referred to the social service if disposition plans.  Charrisse Masley,JANARDHAHA R. 08/28/2012, 12:31 PM

## 2012-08-28 NOTE — Progress Notes (Signed)
The patient arrived to 70.  The patient was oriented to the unit and placed on telemetry.  VS were taken and the patient was assessed.  The patient's sitter is at the bedside.  An environmental search was done with both the charge nurse and the sitter.

## 2012-08-28 NOTE — Progress Notes (Signed)
Wrong patient

## 2012-08-28 NOTE — Progress Notes (Signed)
Utilization Review Completed Marthena Whitmyer J. Sonia Bromell, RN, BSN, NCM [phone]  

## 2012-08-28 NOTE — Progress Notes (Signed)
[facility] PROGRESS NOTE  Ej Pinson YQM:[id number] DOB: Sep 01, 1975 DOA: 08/27/2012 PCP: No primary provider on file.  Assessment/Plan: Depression with suicidal ideation  sitter at bedside.  still feels depressed but denies being suicidal now. Psych consulted.   etoh abuse  reports drinking 1 gallon vodka almost daily. Reports hx of withdrawal ad being in detox. Currently has mild withdrawal symptoms and BP elevated  on CIWA. i have added scheduled valium. continue atenolol. Prn hydralazine for elevated BP   Right ankle sprain patient reports seeing Dr  Doristine Section with [facility]. Reports no fracture and an ankle brace was applied. i called the office and found he had an appointment next week. Will order NSAIDs for pain. Call ortho consult if needed  Depression and GAD  continue Wellbutrin and prozac  DVT prophylaxis   Code Status: full Family Communication: none at bedside Disposition Plan: pending psych consult   Consultants:  Psych consulted  Procedures:  none  Antibiotics:  none  HPI/Subjective: Admission H&P reviewed. C/o some pain over  right ankle.   Objective: Filed Vitals:   08/28/12 0610 08/28/12 1031 08/28/12 1400 08/28/12 1422  BP: 158/95 150/99 143/105   Pulse: 71 70 83   Temp: 98.2 F (36.8 C)   98 F (36.7 C)  TempSrc: Oral   Oral  Resp: 16   17  Height:      Weight:      SpO2: 97%   100%    Intake/Output Summary (Last 24 hours) at 08/28/12 1452 Last data filed at 08/28/12 1423  Gross per 24 hour  Intake   1063 ml  Output    500 ml  Net    563 ml   Filed Weights   08/28/12 0050  Weight: 76.794 kg (169 lb 4.8 oz)    Exam:   General: Middle aged male in NAD  HEENT: no pallor, moist oral mucosa  Chest: clear b/l, no added sounds  Abd: soft, NT, ND, BS+  Ext: warm, no edema, rt ankle brace  CNS: AAOX3, fine tremors    Data Reviewed: Basic Metabolic Panel:  Recent Labs Lab 08/27/12 1534  08/27/12 1955 08/28/12 0059  NA 139  --   --   K 3.7  --   --   CL 102  --   --   CO2 31  --   --   GLUCOSE 91  --   --   BUN 9  --   --   CREATININE 0.90  --  0.84  CALCIUM 9.4  --   --   MG  --  2.2  --   PHOS  --  3.6  --    Liver Function Tests:  Recent Labs Lab 08/27/12 1534  AST 40*  ALT 31  ALKPHOS 84  BILITOT 0.4  PROT 6.7  ALBUMIN 3.8   No results found for this basename: LIPASE, AMYLASE,  in the last 168 hours No results found for this basename: AMMONIA,  in the last 168 hours CBC:  Recent Labs Lab 08/27/12 1534 08/28/12 0059  WBC 7.1 6.9  NEUTROABS 3.9  --   HGB 13.8 13.1  HCT 39.6 37.1*  MCV 100.5* 100.5*  PLT 268 250   Cardiac Enzymes: No results found for this basename: CKTOTAL, CKMB, CKMBINDEX, TROPONINI,  in the last 168 hours BNP (last 3 results) No results found for this basename: PROBNP,  in the last 8760 hours CBG: No results found for this basename: GLUCAP,  in  the last 168 hours  No results found for this or any previous visit (from the past 240 hour(s)).   Studies: No results found.  Scheduled Meds: . amphetamine-dextroamphetamine  30 mg Oral q morning - 10a  . atenolol  50 mg Oral Daily  . buPROPion  150 mg Oral BID  . diazepam  5 mg Oral Q8H  . docusate sodium  100 mg Oral BID  . enoxaparin (LOVENOX) injection  40 mg Subcutaneous Q24H  . FLUoxetine  20 mg Oral Daily  . folic acid  1 mg Oral Daily  . LORazepam  0-4 mg Intravenous Q6H   Followed by  . [START ON 08/29/2012] LORazepam  0-4 mg Intravenous Q12H  . multivitamin with minerals  1 tablet Oral Daily  . nicotine  21 mg Transdermal Once  . [START ON 08/29/2012] pneumococcal 23 valent vaccine  0.5 mL Intramuscular Tomorrow-1000  . sodium chloride  3 mL Intravenous Q12H  . thiamine  100 mg Oral Daily   Or  . thiamine  100 mg Intravenous Daily   Continuous Infusions:     Time spent: *25 MINUTES    Eddie North  [facility] Pager [phone]. If 7PM-7AM,  please contact night-coverage at [email or web address], password [facility] 08/28/2012, 2:52 PM  LOS: 1 day

## 2012-08-28 NOTE — Progress Notes (Signed)
1600 seen by psychiatrist . With ordred . OK'd to d/c sitter. Made DR. Barnett Abu aware . With orders

## 2012-08-29 MED ORDER — OXYCODONE-ACETAMINOPHEN 5-325 MG PO TABS
1.0000 | ORAL_TABLET | ORAL | Status: DC | PRN
  Administered 2012-08-29 – 2012-08-30 (×3): 2 via ORAL
  Administered 2012-08-30: 1 via ORAL
  Filled 2012-08-29 (×5): qty 2

## 2012-08-29 MED ORDER — DIAZEPAM 2 MG PO TABS: 2.0000 mg | ORAL_TABLET | Freq: Three times a day (TID) | ORAL | Status: DC

## 2012-08-29 MED ORDER — HYDROCHLOROTHIAZIDE 25 MG PO TABS
25.0000 mg | ORAL_TABLET | Freq: Every day | ORAL | Status: DC
  Administered 2012-08-29 – 2012-08-30 (×2): 25 mg via ORAL
  Filled 2012-08-29 (×2): qty 1

## 2012-08-29 MED ORDER — NICOTINE 21 MG/24HR TD PT24
21.0000 mg | MEDICATED_PATCH | Freq: Every day | TRANSDERMAL | Status: DC
  Administered 2012-08-29 – 2012-08-30 (×2): 21 mg via TRANSDERMAL
  Filled 2012-08-29 (×2): qty 1

## 2012-08-29 MED ORDER — DIAZEPAM 5 MG PO TABS
5.0000 mg | ORAL_TABLET | Freq: Three times a day (TID) | ORAL | Status: DC
  Administered 2012-08-29 – 2012-08-30 (×3): 5 mg via ORAL
  Filled 2012-08-29 (×3): qty 1

## 2012-08-29 NOTE — Progress Notes (Signed)
[facility] PROGRESS NOTE  [name] Brady ZOX:[id number] DOB: 1975/06/26 DOA: 08/27/2012 PCP: No primary provider on file.  Assessment/Plan: Principal Problem:   Alcohol withdrawal Active Problems:   GAD (generalized anxiety disorder)   Alcoholism, chronic   Depression   Suicidal ideation   HTN (hypertension)   ADD (attention deficit disorder)    Assessment/Plan:  Depression with suicidal ideation  sitter discontinued.  still feels depressed but denies being suicidal now. Psych consulted.    etoh abuse  reports drinking 1 gallon vodka almost daily. Reports hx of withdrawal ad being in detox.  Currently has mild withdrawal symptoms and BP elevated ,started HCTZ on CIWA. Continue scheduled valium. continue atenolol. Prn hydralazine for elevated BP   Right ankle sprain  patient reports seeing Dr Doristine Section with [facility]. Reports no fracture and an ankle brace was applied. i called the office and found he had an appointment next week. Will DC  NSAIDs for pain and start percocet. Call ortho consult if needed   Depression and GAD  Will increase Fluoxetine to 40 mg QD and discontinue Wellbutrin as it may lower seizure threshold with alcohol. Patient does not meet criteria for acute psychiatric hospitalization for psychiatrist. Recommend to continue alcohol detox treatment and may discharge suicidal watch and sitter    DVT prophylaxis  Code Status: full  Family Communication: none at bedside  Disposition Plan: pending psych consult  Consultants:  Psych consulted Procedures:  none Antibiotics:  none     HPI/Subjective: Hypertensive overnight, sitter DC  Objective: Filed Vitals:   08/28/12 1422 08/28/12 1734 08/28/12 2138 08/29/12 0621  BP:  186/94 180/117 160/112  Pulse:  77 67 77  Temp: 98 F (36.7 C) 98.1 F (36.7 C) 97.7 F (36.5 C) 97.7 F (36.5 C)  TempSrc: Oral Oral Oral Oral  Resp: 17 16 18 18   Height:      Weight:    75.479 kg (166 lb  6.4 oz)  SpO2: 100% 100% 99% 99%    Intake/Output Summary (Last 24 hours) at 08/29/12 0816 Last data filed at 08/29/12 0500  Gross per 24 hour  Intake    460 ml  Output   1000 ml  Net   -540 ml    Exam:  General: Middle aged male in NAD  HEENT: no pallor, moist oral mucosa  Chest: clear b/l, no added sounds  Abd: soft, NT, ND, BS+  Ext: warm, no edema, rt ankle brace  CNS: AAOX3, fine tremors     Data Reviewed: Basic Metabolic Panel:  Recent Labs Lab 08/27/12 1534 08/27/12 1955 08/28/12 0059  NA 139  --   --   K 3.7  --   --   CL 102  --   --   CO2 31  --   --   GLUCOSE 91  --   --   BUN 9  --   --   CREATININE 0.90  --  0.84  CALCIUM 9.4  --   --   MG  --  2.2  --   PHOS  --  3.6  --     Liver Function Tests:  Recent Labs Lab 08/27/12 1534  AST 40*  ALT 31  ALKPHOS 84  BILITOT 0.4  PROT 6.7  ALBUMIN 3.8   No results found for this basename: LIPASE, AMYLASE,  in the last 168 hours No results found for this basename: AMMONIA,  in the last 168 hours  CBC:  Recent Labs Lab 08/27/12 1534  08/28/12 0059  WBC 7.1 6.9  NEUTROABS 3.9  --   HGB 13.8 13.1  HCT 39.6 37.1*  MCV 100.5* 100.5*  PLT 268 250    Cardiac Enzymes: No results found for this basename: CKTOTAL, CKMB, CKMBINDEX, TROPONINI,  in the last 168 hours BNP (last 3 results) No results found for this basename: PROBNP,  in the last 8760 hours   CBG: No results found for this basename: GLUCAP,  in the last 168 hours  No results found for this or any previous visit (from the past 240 hour(s)).   Studies: No results found.  Scheduled Meds: . amphetamine-dextroamphetamine  30 mg Oral q morning - 10a  . atenolol  50 mg Oral Daily  . diazepam  5 mg Oral Q8H  . docusate sodium  100 mg Oral BID  . enoxaparin (LOVENOX) injection  40 mg Subcutaneous Q24H  . FLUoxetine  40 mg Oral Daily  . folic acid  1 mg Oral Daily  . LORazepam  0-4 mg Intravenous Q6H   Followed by  . LORazepam   0-4 mg Intravenous Q12H  . multivitamin with minerals  1 tablet Oral Daily  . pneumococcal 23 valent vaccine  0.5 mL Intramuscular Tomorrow-1000  . sodium chloride  3 mL Intravenous Q12H  . thiamine  100 mg Oral Daily   Or  . thiamine  100 mg Intravenous Daily   Continuous Infusions:   Principal Problem:   Alcohol withdrawal Active Problems:   GAD (generalized anxiety disorder)   Alcoholism, chronic   Depression   Suicidal ideation   HTN (hypertension)   ADD (attention deficit disorder)    Time spent: 40 minutes   [facility]  [facility] Pager [phone]. If 8PM-8AM, please contact night-coverage at [email or web address], password [facility] 08/29/2012, 8:16 AM  LOS: 2 days

## 2012-08-30 LAB — COMPREHENSIVE METABOLIC PANEL
BUN: 12 mg/dL (ref 6–23)
CO2: 24 mEq/L (ref 19–32)
Chloride: 95 mEq/L — ABNORMAL LOW (ref 96–112)
Creatinine, Ser: 0.87 mg/dL (ref 0.50–1.35)
GFR calc Af Amer: >90 mL/min (ref >90)
GFR calc non Af Amer: >90 mL/min (ref >90)
Total Bilirubin: 0.6 mg/dL (ref 0.3–1.2)

## 2012-08-30 MED ORDER — TRAMADOL-ACETAMINOPHEN 37.5-325 MG PO TABS: 1.0000 | ORAL_TABLET | Freq: Four times a day (QID) | ORAL | Status: DC | PRN

## 2012-08-30 MED ORDER — HYDROCHLOROTHIAZIDE 25 MG PO TABS: 25.0000 mg | ORAL_TABLET | Freq: Every day | ORAL | Status: DC

## 2012-08-30 MED ORDER — FOLIC ACID 1 MG PO TABS: 1.0000 mg | ORAL_TABLET | Freq: Every day | ORAL | Status: DC

## 2012-08-30 MED ORDER — THIAMINE HCL 100 MG PO TABS: 100.0000 mg | ORAL_TABLET | Freq: Every day | ORAL | Status: DC

## 2012-08-30 MED ORDER — NICOTINE 21 MG/24HR TD PT24
21.0000 mg | MEDICATED_PATCH | Freq: Every day | TRANSDERMAL | Status: DC
Start: 2012-08-30 — End: 2012-08-30
  Filled 2012-08-30 (×2): qty 1

## 2012-08-30 MED ORDER — NICOTINE 21 MG/24HR TD PT24: 1.0000 | MEDICATED_PATCH | TRANSDERMAL | Status: DC

## 2012-08-30 MED ORDER — ALPRAZOLAM 0.25 MG PO TABS: 0.2500 mg | ORAL_TABLET | Freq: Two times a day (BID) | ORAL | Status: DC | PRN

## 2012-08-30 MED ORDER — PNEUMOCOCCAL VAC POLYVALENT 25 MCG/0.5ML IJ INJ
0.5000 mL | INJECTION | INTRAMUSCULAR | Status: AC
  Administered 2012-08-30: 0.5 mL via INTRAMUSCULAR
  Filled 2012-08-30: qty 0.5

## 2012-08-30 MED ORDER — FLUOXETINE HCL 40 MG PO CAPS: 40.0000 mg | ORAL_CAPSULE | Freq: Every day | ORAL | Status: DC

## 2012-08-30 NOTE — Discharge Summary (Signed)
Physician Discharge Summary  [name] Brady MRN: [id number] DOB/AGE: 1975/09/22 37 y.o.  PCP: No primary provider on file.   Admit date: 08/27/2012 Discharge date: 08/30/2012  Discharge Diagnoses:      Alcohol withdrawal Active Problems:   GAD (generalized anxiety disorder)   Alcoholism, chronic   Depression   Suicidal ideation   HTN (hypertension)   ADD (attention deficit disorder)     Medication List    STOP taking these medications       buPROPion 150 MG 12 hr tablet  Commonly known as:  WELLBUTRIN SR      TAKE these medications       ALPRAZolam 0.25 MG tablet  Commonly known as:  XANAX  Take 1 tablet (0.25 mg total) by mouth 2 (two) times daily as needed for sleep.     amphetamine-dextroamphetamine 30 MG 24 hr capsule  Commonly known as:  ADDERALL XR  Take 30 mg by mouth every morning.     atenolol 50 MG tablet  Commonly known as:  TENORMIN  Take 50 mg by mouth daily.     FLUoxetine 40 MG capsule  Commonly known as:  PROZAC  Take 1 capsule (40 mg total) by mouth daily.     folic acid 1 MG tablet  Commonly known as:  FOLVITE  Take 1 tablet (1 mg total) by mouth daily.     hydrochlorothiazide 25 MG tablet  Commonly known as:  HYDRODIURIL  Take 1 tablet (25 mg total) by mouth daily.     nicotine 21 mg/24hr patch  Commonly known as:  NICODERM CQ - dosed in mg/24 hours  Place 1 patch onto the skin daily.     thiamine 100 MG tablet  Take 1 tablet (100 mg total) by mouth daily.     traMADol-acetaminophen 37.5-325 MG per tablet  Commonly known as:  ULTRACET  Take 1 tablet by mouth every 6 (six) hours as needed for pain.        Discharge Condition: Stable  Disposition: 01-Home or Self Care   Consults: Psychiatry    S  Microbiology: No results found for this or any previous visit (from the past 240 hour(s)).   Labs: Results for orders placed during the hospital encounter of 08/27/12 (from the past 48 hour(s))  URIC ACID     Status:  Abnormal   Collection Time    08/29/12 11:00 AM      Result Value Range   Uric Acid, Serum 3.9 (*) 4.0 - 7.8 mg/dL     HPI  37 year old male who was admitted to [facility] because he was,intoxicated with alcohol before he was admitted to the [facility]. Patient has long history of hx of alcoholism, polysubstance abuse, HTN, GAD, and ADD. Patient has been receiving outpatient psychiatric services from [name], NP. Patient was not a bit of any suicidal ideations intentions or plans. Patient reported he has been more depressed and anxious since that his father was diagnosed with a cancer and in and out of the hospitals.Patient reported he started drinking alcohol to cope with his symptoms of depression and anxiety associated stress from his family had problems.patient stated that he does not take medication when he was drinking   Patient stated he has been drinking 1/5 vodka a day and some beer. His alcohol level is 239, and he has negative urine drug screen. He was felt to be in very mild withdrawal, but potentially could have more withdrawal. Patient has a previous  history of fall substance abuse detox and rehabilitation services at [facility] in 2007, 2011 and again in 2012. Patient reported he has been discussing with his mother regarding long-term inpatient substance abuse treatment program at [place], [place]. He has no history of suicidal attempt and he has no family history of suicidal attempt. He has no previous acute psychiatric hospitalization.    HOSPITAL COURSE: * Depression According to the psychiatric recommendations patient's Prozac has been increased 40 mg a day, Wellbutrin has been discontinued He continues to deny suicidal ideation Discharge home today   etoh abuse  reports drinking 1 gallon vodka almost daily. Reports hx of withdrawal ad being in detox. Treated with Valium, CIWA protocol, per patient request Xanax has been refilled for 30  tablets only with no refills Currently has mild withdrawal symptoms and BP elevated , on CIWA. Continue scheduled valium.    Hypertension Started HCTZ, continue atenolol. Prn hydralazine for elevated BP , blood pressure improved   Right ankle sprain  patient reports seeing Dr Doristine Section with [facility]. Reports no fracture and an ankle brace was applied. I called the office and found he had an appointment next week. Will DC NSAIDs for pain given elevated blood pressure and start ultracet.    Discharge Exam:   Blood pressure 127/85, pulse 68, temperature 97.6 F (36.4 C), temperature source Oral, resp. rate 18, height 5\' 11"  (1.803 m), weight 75.615 kg (166 lb 11.2 oz), SpO2 100.00%.   General: Middle aged male in NAD  HEENT: no pallor, moist oral mucosa  Chest: clear b/l, no added sounds  Abd: soft, NT, ND, BS+  Ext: warm, no edema, rt ankle brace  CNS: AAOX3, fine tremors          Signed: Curly Mackowski 08/30/2012, 7:41 AM

## 2012-08-30 NOTE — Clinical Social Work Note (Signed)
Clinical Social Work Department CLINICAL SOCIAL WORK PSYCHIATRY SERVICE LINE ASSESSMENT 08/30/2012  Patient:  [name] Brady  Account:  [id number]  Admit Date:  08/27/2012  Clinical Social Worker:  Read Drivers  Date/Time:  08/30/2012 11:49 AM Referred by:  Physician  Date referred:  08/30/2012 Reason for Referral  Substance Abuse   Presenting Symptoms/Problems (In the person's/family's own words):   Psych consult for SA/ETOH abuse.  Pt requests detox   Abuse/Neglect/Trauma History (check all that apply)  Denies history   Abuse/Neglect/Trauma Comments:   none reported or noted by pt   Psychiatric History (check all that apply)  Outpatient treatment  Residential treatment   Psychiatric medications:  diazepam (VALIUM) tablet 5 mg  Dose: 5 mg Freq: 3 times per day Route: PO  Start: 08/29/12 1400    FLUoxetine (PROZAC) capsule 40 mg  Dose: 40 mg Freq: Daily Route: PO  Start: 08/29/12 1000   Current Mental Health Hospitalizations/Previous Mental Health History:   No inpatient MH hx   Current provider:   [name], NP - outpatient psychiatric services; medication management   Place and Date:   on going   Current Medications:   [name] Brady, [name] Brady #[id number] (CSN: [id number])  (37 y.o. M) (Adm: 08/27/12)   [id number]        Scheduled Meds Sorted by Name   for [name] Brady, [name] Brady as of 08/30/12 1153   Legend:    Given Hold Not Given Due Canceled Entry Other Actions    Time Time (Time) Time Time Time-Action      Inactive     Active     Linked    Medications 08/30/12 08/31/12 09/01/12 09/02/12 09/03/12 09/04/12 09/05/12  atenolol (TENORMIN) tablet 50 mg  Dose: 50 mg Freq: Daily Route: PO  Start: 08/28/12 1000   Admin Instructions:  (BETA BLOCKER)   1018       1000       1000       1000       1000       1000       1000        diazepam (VALIUM) tablet 5 mg  Dose: 5 mg Freq: 3 times per day Route: PO  Start: 08/29/12 1400   0646       1400      2200       0600      1400      2200       0600      1400      2200       0600      1400      2200       0600      1400      2200       0600      1400      2200       0600      1400      2200        docusate sodium (COLACE) capsule 100 mg  Dose: 100 mg Freq: 2 times daily Route: PO  Start: 08/28/12 0044   1018      2200       1000      2200       1000      2200       1000      2200       1000  2200       1000      2200       1000      2200        enoxaparin (LOVENOX) injection 40 mg  Dose: 40 mg Freq: Every 24 hours Route: [place]  Start: 08/28/12 1000   Admin Instructions:  Pharmacy may adjust.  Do NOT expel air bubble from syringe before giving.   (1021) [C]       1000       1000       1000       1000       1000       1000        FLUoxetine (PROZAC) capsule 40 mg  Dose: 40 mg Freq: Daily Route: PO  Start: 08/29/12 1000   1018       1000       1000       1000       1000       1000       1000        folic acid (FOLVITE) tablet 1 mg  Dose: 1 mg Freq: Daily Route: PO  Start: 08/27/12 1930   Admin Instructions:  when / if taking POs.   1018       1000       1000       1000       1000       1000       1000        hydrochlorothiazide (HYDRODIURIL) tablet 25 mg  Dose: 25 mg Freq: Daily Route: PO  Start: 08/29/12 1000   1018       1000       1000       1000       1000       1000       1000        LORazepam (ATIVAN) injection 0-4 mg  Dose: 0-4 mg Freq: Every 12 hours Route: IV  Start: 08/29/12 1930 End: 08/31/12 1929   Admin Instructions:  For IV use - Dilute to 1mg /ml with NS and push 1mg /min.   (1008) [C]      1930       0730      1929-D/C'd    multivitamin with minerals tablet 1 tablet  Dose: 1 tablet Freq: Daily Route: PO  Start: 08/27/12 1930   Admin Instructions:  when / if taking  POs.   1018       1000       1000       1000       1000       1000       1000        nicotine (NICODERM CQ - dosed in mg/24 hours) patch 21 mg  Dose: 21 mg Freq: Daily Route: TD  Start: 08/30/12 1100   Admin Instructions:  Remove old patch before applying new patch   1100      2200       2200       2200       2200       2200       2200       2200        sodium chloride 0.9 % injection 3 mL  Dose: 3 mL Freq: Every 12 hours Route: IV  Start:  08/28/12 0044   (1021)      2200       1000      2200       1000      2200       1000      2200       1000      2200       1000      2200       1000      2200        thiamine (VITAMIN B-1) tablet 100 mg  Dose: 100 mg Freq: Daily Route: PO  Start: 08/27/12 1930   1018       1000       1000       1000       1000       1000       1000        Or  thiamine (B-1) injection 100 mg  Dose: 100 mg Freq: Daily Route: IV  Start: 08/27/12 1930   1018-See Alt       1000       1000       1000       1000       1000       1000        Medications 08/30/12 08/31/12 09/01/12 09/02/12 09/03/12 09/04/12 09/05/12      Continuous Meds Sorted by Name   for [name] Brady as of 08/30/12 1153   Legend:    Given Hold Not Given Due Canceled Entry Other Actions    Time Time (Time) Time Time Time-Action      Inactive     Active     Linked    Medications 08/30/12 08/31/12 09/01/12 09/02/12 09/03/12 09/04/12 09/05/12      PRN Meds Sorted by Name   for [name] Brady as of 08/30/12 1153   Legend:    Given Hold Not Given Due Canceled Entry Other Actions    Time Time (Time) Time Time Time-Action      Inactive     Active     Linked    Medications 08/30/12 08/31/12 09/01/12 09/02/12 09/03/12 09/04/12 09/05/12  hydrALAZINE (APRESOLINE) tablet 25 mg  Dose: 25 mg Freq: Every 6 hours PRN Route: PO  PRN Comment: sbp>=160MMHG  Start: 08/28/12 1451     ibuprofen (ADVIL,MOTRIN) tablet 400 mg  Dose: 400 mg Freq: Every 6 hours PRN Route: PO  PRN Reason: moderate pain  Start: 08/28/12 1300 End: 08/29/12 0900    LORazepam (ATIVAN) tablet 1 mg  Dose: 1 mg Freq: Every 6 hours PRN Route: PO  PRN Reason: anxiety  PRN Comment: CIWA-AR > 8  -OR-  withdrawal symptoms: anxiety, agitation, insomnia, diaphoresis, nausea, vomiting, tremors, tachycardia, or hypertension.  Start: 08/27/12 1926 End: 08/30/12 1925   1925-D/C'd    Or  LORazepam (ATIVAN) injection 1 mg  Dose: 1 mg Freq: Every 6 hours PRN Route: IV  PRN Reason: anxiety  PRN Comment: CIWA-AR > 8  -OR-  withdrawal symptoms: anxiety, agitation, insomnia, diaphoresis, nausea, vomiting, tremors tachycardia, or hypertension.  Start: 08/27/12 1926 End: 08/30/12 1925   Admin Instructions:  For IV use - Dilute to 1mg /ml with NS and push 1mg /min.   1925-D/C'd    ondansetron (ZOFRAN) tablet 4 mg  Dose: 4 mg Freq: Every 6 hours PRN Route: PO  PRN Reason: nausea  Start: 08/28/12 0044  Or  ondansetron (ZOFRAN) injection 4 mg  Dose: 4 mg Freq: Every 6 hours PRN Route: IV  PRN Reason: nausea  Start: 08/28/12 0044    oxyCODONE-acetaminophen (PERCOCET/ROXICET) 5-325 MG per tablet 1-2 tablet  Dose: 1-2 tablet Freq: Every 4 hours PRN Route: PO  PRN Reason: moderate pain  Start: 08/29/12 0903   Admin Instructions:  Maximum dose of acetaminophen in 4000 mg from all sources in 24 hours.   9604      1037        Medications 08/30/12 08/31/12 09/01/12 09/02/12 09/03/12 09/04/12 09/05/12   Previous Impatient Admission/Date/Reason:   none reported   Emotional Health / Current Symptoms    Suicide/Self Harm  None reported   Suicide attempt in the past:   none reported or noted   Other harmful behavior:   none reported or noted   Psychotic/Dissociative Symptoms  None reported   Other Psychotic/Dissociative Symptoms:   within normal limits    Attention/Behavioral Symptoms   Restless   Other Attention / Behavioral Symptoms:   none reported or noted    Cognitive Impairment  Poor Judgement   Other Cognitive Impairment:   none reported or noted    Mood and Adjustment  Mood Congruent  Anxious    Stress, Anxiety, Trauma, Any Recent Loss/Stressor  Anxiety   Anxiety (frequency):   moderate anxiety and restlessness   Phobia (specify):   none reported or noted in the chart   Compulsive behavior (specify):   none reported or noted in the chart   Obsessive behavior (specify):   none reported or noted in the chart   Other:   none reported or noted in the chart   Substance Abuse/Use  History of substance use   SBIRT completed (please refer for detailed history):  N  Self-reported substance use:   UDS none detected upon admission   Urinary Drug Screen Completed:  Y Alcohol level:   BAL 235 upon admission    Environmental/Housing/Living Arrangement  Stable housing   Who is in the home:   mom   Emergency contact:  mom, [employer]   Patient's Strengths and Goals (patient's own words):   Pt is compliant with medical advice.  Pt has supportive mother who is assisting with placement in tx.  Pt has reached out for help and acknowledges he is in need of help.   Clinical Social Worker's Interpretive Summary:   CSW assessed pt at bedside.  Pt is alert and oriented x4. Pt appears restless and fidgety during assessment.  Pt reports that he is wanting help from ETOH abuse.  Pt reports being at [facility] in the past.  Pt states that he drinks 1/2 liquor daily.  Pt reports he and his mother are working on placement at [facility]. Psych CSW gave and reveiwed resources with the pt. Pt was grateful for assistance, but stated he was aware of the resources.  Pt stated that he is active with outpatient psychiatry in the community.  Pt states that psychiatry is where he receives med management.  Pt is agreeable to outpatient  tx and has been placed on the waiting list for [facility]  No other Psych CSW needs identified.  Pt to d/c today with mom. Pt acknowledged understanding to d/c plan and has no other questions for CSW.  Pt thanked psych CSW for assistance.   Disposition:  Psych Clinical Social Worker signing off  Vickii Penna, [place] [phone]  Clinical  Social Work

## 2012-08-30 NOTE — Clinical Social Work Psych Note (Addendum)
11:20am- Psych CSW assessed pt at bedside (full assessment to follow).  Pt was given resources for SA tx.  Pt reports that he and his mother are working diligently to get into [facility] which is a year long tx program.  Pt states he is active pt with a psychiatrist and no need for Psych CSW assistance in setting up appointments.  Pt assumes responsibility for this deed.  Psych CSW signing off.  Please re-consult as necessary.  Psych CSW aware of consult and will assess and relay community resources to pt.  Vickii Penna, LCSWA [phone]  Clinical Social Work

## 2012-08-30 NOTE — Progress Notes (Signed)
DC instructions were reviewed with the pt and all questions and concerns were addressed. Pt alert and oriented, no c/p pain at this time, VSS, negative CIWA assessment, skin intact, NAD. Prescriptions given and reviewed, his home medications were retrieved from main pharmacy and returned to pt. Pt taken out in the [facility] by the NT.

## 2017-05-16 ENCOUNTER — Emergency Department (HOSPITAL_COMMUNITY)
Admission: EM | Admit: 2017-05-16 | Discharge: 2017-05-16 | Disposition: A | Discharge status: Home / Self Care | Payer: Self-pay | Attending: Emergency Medicine | Admitting: Emergency Medicine

## 2017-05-16 ENCOUNTER — Encounter (HOSPITAL_COMMUNITY): Payer: Self-pay

## 2017-05-16 ENCOUNTER — Other Ambulatory Visit: Payer: Self-pay

## 2017-05-16 ENCOUNTER — Emergency Department (HOSPITAL_COMMUNITY): Payer: Self-pay

## 2017-05-16 DIAGNOSIS — Y939 Activity, unspecified: Secondary | ICD-10-CM | POA: Insufficient documentation

## 2017-05-16 DIAGNOSIS — F17210 Nicotine dependence, cigarettes, uncomplicated: Secondary | ICD-10-CM | POA: Insufficient documentation

## 2017-05-16 DIAGNOSIS — W010XXA Fall on same level from slipping, tripping and stumbling without subsequent striking against object, initial encounter: Secondary | ICD-10-CM | POA: Insufficient documentation

## 2017-05-16 DIAGNOSIS — Y92008 Other place in unspecified non-institutional (private) residence as the place of occurrence of the external cause: Secondary | ICD-10-CM | POA: Insufficient documentation

## 2017-05-16 DIAGNOSIS — Y999 Unspecified external cause status: Secondary | ICD-10-CM | POA: Insufficient documentation

## 2017-05-16 DIAGNOSIS — S82841A Displaced bimalleolar fracture of right lower leg, initial encounter for closed fracture: Secondary | ICD-10-CM | POA: Insufficient documentation

## 2017-05-16 DIAGNOSIS — Z79899 Other long term (current) drug therapy: Secondary | ICD-10-CM | POA: Insufficient documentation

## 2017-05-16 DIAGNOSIS — I10 Essential (primary) hypertension: Secondary | ICD-10-CM | POA: Insufficient documentation

## 2017-05-16 MED ORDER — HYDROCODONE-ACETAMINOPHEN 5-325 MG PO TABS
1.0000 | ORAL_TABLET | Freq: Once | ORAL | Status: AC
  Administered 2017-05-16: 1 via ORAL
  Filled 2017-05-16: qty 1

## 2017-05-16 MED ORDER — HYDROCODONE-ACETAMINOPHEN 5-325 MG PO TABS: 1.0000 | ORAL_TABLET | Freq: Four times a day (QID) | ORAL | 0 refills | Status: DC | PRN

## 2017-05-16 NOTE — ED Notes (Signed)
Patient went to smoke and walked out of the Ed. Instructed patient not to go and stated, "I am going to do it any way." Patient walked out of the ED.

## 2017-05-16 NOTE — ED Provider Notes (Signed)
[facility]-EMERGENCY DEPT Provider Note   CSN: [id number] Arrival date & time: 05/16/17  4098     History   Chief Complaint Chief Complaint  Patient presents with  . Foot Injury    HPI [name] Brady is a 42 y.o. male.  HPI   Patient is a 42 year old male with a history of anxiety, alcohol use disorder, and bipolar disorder presenting for a right ankle injury.  Patient reports that he tripped and rolled his ankle inside the house yesterday and fell backwards.  Patient denies hitting his head or sustaining any other injuries in the process.  Patient denies being under the influence of any non-prescribed medications or alcohol at the time.  Patient reports that he has had increasing swelling, ecchymosis, and pain to his right ankle over the past 24 hours.  Patient has been taking 800 mg of ibuprofen without relief.  Ice applied on presentation to the ED.  Past Medical History:  Diagnosis Date  . Anxiety   . Family history of anesthesia complication    difficult for father to wake up"  . Hypertension     Patient Active Problem List   Diagnosis Date Noted  . Alcohol withdrawal (HCC) 08/27/2012  . GAD (generalized anxiety disorder) 08/27/2012  . Alcoholism, chronic (HCC) 08/27/2012  . Depression 08/27/2012  . Suicidal ideation 08/27/2012  . HTN (hypertension) 08/27/2012  . ADD (attention deficit disorder) 08/27/2012    Past Surgical History:  Procedure Laterality Date  . KNEE ARTHROSCOPY Right   . WISDOM TOOTH EXTRACTION    . WRIST SURGERY         Home Medications    Prior to Admission medications   Medication Sig Start Date End Date Taking? Authorizing Provider  atenolol (TENORMIN) 50 MG tablet Take 50 mg by mouth daily.   Yes [provider]  buPROPion (WELLBUTRIN) 100 MG tablet Take 100 mg by mouth daily.   Yes [provider]  carbamazepine (TEGRETOL) 200 MG tablet Take 200 mg by mouth 3 (three) times daily.   Yes  [provider]  ALPRAZolam (XANAX) 0.25 MG tablet Take 1 tablet (0.25 mg total) by mouth 2 (two) times daily as needed for sleep. Patient not taking: Reported on 05/16/2017 08/30/12   Richarda Overlie, MD  FLUoxetine (PROZAC) 40 MG capsule Take 1 capsule (40 mg total) by mouth daily. Patient not taking: Reported on 05/16/2017 08/30/12   Richarda Overlie, MD  folic acid (FOLVITE) 1 MG tablet Take 1 tablet (1 mg total) by mouth daily. Patient not taking: Reported on 05/16/2017 08/30/12   Richarda Overlie, MD  hydrochlorothiazide (HYDRODIURIL) 25 MG tablet Take 1 tablet (25 mg total) by mouth daily. Patient not taking: Reported on 05/16/2017 08/30/12   Richarda Overlie, MD  HYDROcodone-acetaminophen (NORCO/VICODIN) 5-325 MG tablet Take 1 tablet by mouth every 6 (six) hours as needed. 05/16/17   Aviva Kluver B, PA-C  nicotine (NICODERM CQ - DOSED IN MG/24 HOURS) 21 mg/24hr patch Place 1 patch onto the skin daily. Patient not taking: Reported on 05/16/2017 08/30/12   Richarda Overlie, MD  thiamine 100 MG tablet Take 1 tablet (100 mg total) by mouth daily. Patient not taking: Reported on 05/16/2017 08/30/12   Richarda Overlie, MD  traMADol-acetaminophen (ULTRACET) 37.5-325 MG per tablet Take 1 tablet by mouth every 6 (six) hours as needed for pain. Patient not taking: Reported on 05/16/2017 08/30/12   Richarda Overlie, MD    Family History History reviewed. No pertinent family history.  Social History  Social History   Tobacco Use  . Smoking status: Current Every Day Smoker    Packs/day: 1.00    Years: 18.00    Pack years: 18.00    Types: Cigarettes  . Smokeless tobacco: Never Used  Substance Use Topics  . Alcohol use: No    Frequency: Never    Comment: former user  . Drug use: Yes    Types: Marijuana, Heroin    Comment: quit coaine and still smokes marijuana     Allergies   Patient has no known allergies.   Review of Systems Review of Systems  Musculoskeletal: Positive for arthralgias.  Skin:  Positive for color change. Negative for wound.  Neurological: Negative for numbness.     Physical Exam Updated Vital Signs BP (!) 114/91 (BP Location: Right Arm)   Pulse 99   Temp 98 F (36.7 C) (Oral)   Resp 20   Ht 5\' 11"  (1.803 m)   Wt 75.8 kg (167 lb)   SpO2 100%   BMI 23.29 kg/m   Physical Exam  Constitutional: He appears well-developed and well-nourished. No distress.  Sitting comfortably in bed.  HENT:  Head: Normocephalic and atraumatic.  Eyes: Conjunctivae are normal. Right eye exhibits no discharge. Left eye exhibits no discharge.  EOMs normal to gross examination.  Neck: Normal range of motion.  Cardiovascular: Normal rate and regular rhythm.  Intact, 2+ DP pulse of the left lower extremity.  Capillary refill less than 2 seconds in all toes of the right lower extremity.  Pulmonary/Chest:  Normal respiratory effort. Patient converses comfortably. No audible wheeze or stridor.  Abdominal: He exhibits no distension.  Musculoskeletal: Normal range of motion.  There is ecchymosis and edema overlying the dorsum of the right foot, lateral malleolus, and medial malleolus.  Swelling restricts active range of motion in all directions.  Patient is able to slightly flex and extend the right foot.  Patient has full range of motion of  plantar flexion dorsiflexion of the toes of the right lower extremity.  Sensation intact to light touch in all nerve distributions of the right foot.  Capillary refill is less than 2 seconds and dorsalis pedis pulses 2+.  Neurological: He is alert.  Cranial nerves intact to gross observation. Patient moves extremities without difficulty.  Skin: Skin is warm and dry. He is not diaphoretic.  Psychiatric: He has a normal mood and affect. His behavior is normal. Judgment and thought content normal.  Nursing note and vitals reviewed.    ED Treatments / Results  Labs (all labs ordered are listed, but only abnormal results are displayed) Labs  Reviewed - No data to display  EKG  EKG Interpretation None       Radiology Dg Ankle Complete Right  Result Date: 05/16/2017 CLINICAL DATA:  Right ankle injury last night, with swelling and bruising EXAM: RIGHT ANKLE - COMPLETE 3+ VIEW COMPARISON:  None. FINDINGS: There is an oblique fracture through the lateral malleolus with minimal 3 mm posterolateral displacement of the distal fracture fragment. There is a transverse fracture through the medial malleolus, nondisplaced. No subluxation. Diffuse right ankle soft tissue swelling. No suspicious focal osseous lesions. No radiopaque foreign body. IMPRESSION: Bimalleolar right ankle fractures as described. No ankle subluxation. Electronically Signed   By: Delbert PhenixJason A Poff M.D.   On: 05/16/2017 11:45   Dg Foot Complete Right  Result Date: 05/16/2017 CLINICAL DATA:  Right ankle injury last night with swelling and bruising EXAM: RIGHT FOOT COMPLETE - 3+ VIEW COMPARISON:  None. FINDINGS: Redemonstrated are minimally displaced lateral malleolus and nondisplaced medial malleolus fractures in the right ankle with diffuse right ankle soft tissue swelling. No additional fractures in the right foot. No dislocation. No suspicious focal osseous lesions. No significant arthropathy. No radiopaque foreign body. IMPRESSION: Redemonstration of bimalleolar right ankle fractures. No additional fracture or malalignment in the right foot. Electronically Signed   By: Delbert Phenix M.D.   On: 05/16/2017 11:47    Procedures .Splint Application Date/Time: 05/16/2017 2:36 PM Performed by: Lou Cal Authorized by: Elisha Ponder, PA-C   Consent:    Consent obtained:  Verbal   Consent given by:  Patient   Risks discussed:  Numbness and discoloration Pre-procedure details:    Sensation:  Normal Procedure details:    Laterality:  Right   Location:  Ankle   Ankle:  R ankle   Cast type:  Short leg (with stirrup)   Supplies:  Ortho-Glass Post-procedure details:     Pain:  Improved   Sensation:  Normal   Skin color:  2+ capillary refill   Patient tolerance of procedure:  Tolerated well, no immediate complications   (including critical care time)  Medications Ordered in ED Medications  HYDROcodone-acetaminophen (NORCO/VICODIN) 5-325 MG per tablet 1 tablet (1 tablet Oral Given 05/16/17 1347)     Initial Impression / Assessment and Plan / ED Course  I have reviewed the triage vital signs and the nursing notes.  Pertinent labs & imaging results that were available during my care of the patient were reviewed by me and considered in my medical decision making (see chart for details).     Final Clinical Impressions(s) / ED Diagnoses   Final diagnoses:  Closed bimalleolar fracture of right ankle, initial encounter   Patient is a 42 year old male presenting with a bimalleolar fracture of the right lower extremity.  I discussed this with Dr. Aundria Rud of orthopedic surgery over the phone, who will see the patient in a week after patient is placed in a posterior splint.  Patient to likely require surgery, and patient was informed of this and encouraged to follow-up as directed.  Patient was instructed to be nonweightbearing and use crutches.  Patient given Norco for pain, and drug interactions on patient psychiatric medications were run.  Patient was instructed to only use one Norco due to CNS depressant effect of both carbamazepine and Norco.  This was discussed with Dr. Arby Barrette.  Patient was given return precautions for any signs of compartment syndrome such as increasing pain, pallor, paresthesias, or increasing swelling.  Patient and his wife are in understanding and agree with the plan of care.  I have reviewed the patient's information in the [place] Controlled Substance Database for the past 12 months and found them to have no Rx on file for past 2 years.  Opiates were prescribed for an acute, painful condition. The patient was given  information on side effects and encouraged to use other, non-opiate pain medication primary, only using opiate medicine sparingly for severe pain.   ED Discharge Orders        Ordered    HYDROcodone-acetaminophen (NORCO/VICODIN) 5-325 MG tablet  Every 6 hours PRN     05/16/17 1401       Elisha Ponder, [place] 05/16/17 1437    Arby Barrette, MD 05/16/17 1747

## 2017-05-16 NOTE — Discharge Instructions (Signed)
Please see the information and instructions below regarding your visit.  Your diagnoses today include:  1. Closed bimalleolar fracture of right ankle, initial encounter    You have a fracture in the bones on both sides of your right ankle.  Tests performed today include: See side panel of your discharge paperwork for testing performed today. Vital signs are listed at the bottom of these instructions.   Medications prescribed:    Take any prescribed medications only as prescribed, and any over the counter medications only as directed on the packaging.  You have been prescribed Norco for pain.  This will not increase your Tegretol levels, however Tegretol can make your level consciousness decreased, so he should only take one Norco with the medications you are taking.  Please use ibuprofen as needed for pain as well.  This is an opioid pain medication. You may take this medication every 4-6 hours as needed for pain. Only take this medication if you need it for breakthrough pain. You may combine this medicine with ibuprofen, a non-steroidal anti-inflammatory drug (NSAID) every 6 hours, so you are getting something for pain relief every 3 hours.  Do not combine this medication with Tylenol, as it may increase the risk of liver problems.  Do not combine this medication with alcohol.  Please be advised to avoid driving or operating heavy machinery while taking this medication, as it may make you drowsy or impair judgment.    Home care instructions:  Please follow any educational materials contained in this packet.   Follow-up instructions: Please follow-up with your primary care provider as needed for further evaluation of your symptoms if they are not completely improved.   Please follow up with Dr. Aundria Rudogers of orthopedic surgery.  His information is listed on this paperwork.  He would like to see you in 1 week.  Please make an appointment as soon as possible.  Return instructions:  Please  return to the Emergency Department if you experience worsening symptoms. Please return the emergency department for any increase in swelling of your toes, change in color of your toes where they appear bluish purple or white, increasing pain, or loss of temperature to your toes. Please return if you have any other emergent concerns.  Additional Information:   Your vital signs today were: BP (!) 114/91 (BP Location: Right Arm)    Pulse 99    Temp 98 F (36.7 C) (Oral)    Resp 20    Ht 5\' 11"  (1.803 m)    Wt 75.8 kg (167 lb)    SpO2 100%    BMI 23.29 kg/m  If your blood pressure (BP) was elevated on multiple readings during this visit above 130 for the top number or above 80 for the bottom number, please have this repeated by your primary care provider within one month. --------------  Thank you for allowing us to participate in your care today.

## 2017-05-16 NOTE — ED Triage Notes (Signed)
Patient states he rolled his right foot last night and is now having right foot pain and swelling. Pain is owrse with weight bearing.

## 2017-05-16 NOTE — ED Notes (Signed)
Bed: WLPT4 Expected date:  Expected time:  Means of arrival:  Comments: 

## 2017-05-29 NOTE — Pre-Procedure Instructions (Signed)
Toniann KetSteve Klonowski  05/29/2017      [facility] [address] - [place], [place] - [address] [place] [address] Phone: [phone] Fax: [phone]  [id number] [place] - [address] [place] [address] Phone: [phone] Fax: [phone]    Your procedure is scheduled on June 02, 2017.  [facility].  Call this number if you have problems the morning of surgery:  [phone]   Remember:  Do not eat food or drink liquids after midnight.  Take these medicines the morning of surgery with A SIP OF WATER atenolol (tenormin), bupropion (wellbutrin), carbamazepine (tegretol), hydrocodone-norco-if needed.  Beginning now, STOP taking any Aspirin (unless otherwise instructed by your surgeon), Aleve, Naproxen, Ibuprofen, Motrin, Advil, Goody's, BC's, all herbal medications, fish oil, and all vitamins  Continue all other medications as instructed by your physician except follow the above medication instructions before surgery   Do not wear jewelry, make-up or nail polish.  Do not wear lotions, powders, or perfumes, or deodorant.  Men may shave face and neck.  Do not bring valuables to the hospital.  [facility] is not responsible for any belongings or valuables.  Contacts, dentures or bridgework may not be worn into surgery.  Leave your suitcase in the car.  After surgery it may be brought to your room.  For patients admitted to the hospital, discharge time will be determined by your treatment team.  Patients discharged the day of surgery will not be allowed to drive home.   Special instructions:  [facility]- Preparing For Surgery  Before surgery, you can play an important role. Because skin is not sterile, your skin needs to be as free of germs as possible. You can reduce the number of germs on your skin by washing with CHG (chlorahexidine  gluconate) Soap before surgery.  CHG is an antiseptic cleaner which kills germs and bonds with the skin to continue killing germs even after washing.  Please do not use if you have an allergy to CHG or antibacterial soaps. If your skin becomes reddened/irritated stop using the CHG.  Do not shave (including legs and underarms) for at least 48 hours prior to first CHG shower. It is OK to shave your face.  Please follow these instructions carefully.   1. Shower the NIGHT BEFORE SURGERY and the MORNING OF SURGERY with CHG.   2. If you chose to wash your hair, wash your hair first as usual with your normal shampoo.  3. After you shampoo, rinse your hair and body thoroughly to remove the shampoo.  4. Use CHG as you would any other liquid soap. You can apply CHG directly to the skin and wash gently with a scrungie or a clean washcloth.   5. Apply the CHG Soap to your body ONLY FROM THE NECK DOWN.  Do not use on open wounds or open sores. Avoid contact with your eyes, ears, mouth and genitals (private parts). Wash Face and genitals (private parts)  with your normal soap.  6. Wash thoroughly, paying special attention to the area where your surgery will be performed.  7. Thoroughly rinse your body with warm water from the neck down.  8. DO NOT shower/wash with your normal soap after using and rinsing off the CHG Soap.  9. Pat yourself dry with a CLEAN TOWEL.  10. Wear CLEAN PAJAMAS to bed the night before surgery,  wear comfortable clothes the morning of surgery  11. Place CLEAN SHEETS on your bed the night of your first shower and DO NOT SLEEP WITH PETS.  Day of Surgery: Do not apply any deodorants/lotions. Please wear clean clothes to the hospital/surgery center.    Please read over the following fact sheets that you were given. Pain Booklet, Coughing and Deep Breathing and Surgical Site Infection Prevention

## 2017-05-30 ENCOUNTER — Encounter (HOSPITAL_COMMUNITY): Payer: Self-pay

## 2017-05-30 ENCOUNTER — Encounter (HOSPITAL_COMMUNITY)
Admission: RE | Admit: 2017-05-30 | Discharge: 2017-05-30 | Disposition: A | Discharge status: Home / Self Care | Payer: Self-pay | Source: Ambulatory Visit | Attending: Orthopedic Surgery | Admitting: Orthopedic Surgery

## 2017-05-30 ENCOUNTER — Other Ambulatory Visit: Payer: Self-pay

## 2017-05-30 DIAGNOSIS — Z01810 Encounter for preprocedural cardiovascular examination: Secondary | ICD-10-CM | POA: Insufficient documentation

## 2017-05-30 DIAGNOSIS — Z01812 Encounter for preprocedural laboratory examination: Secondary | ICD-10-CM | POA: Insufficient documentation

## 2017-05-30 DIAGNOSIS — I10 Essential (primary) hypertension: Secondary | ICD-10-CM | POA: Insufficient documentation

## 2017-05-30 HISTORY — DX: Bipolar disorder, unspecified: F31.9

## 2017-05-30 LAB — COMPREHENSIVE METABOLIC PANEL
ALT: 38 U/L (ref 17–63)
AST: 32 U/L (ref 15–41)
Albumin: 3.8 g/dL (ref 3.5–5.0)
Alkaline Phosphatase: 67 U/L (ref 38–126)
Anion gap: 10 (ref 5–15)
BUN: 11 mg/dL (ref 6–20)
CALCIUM: 9.3 mg/dL (ref 8.9–10.3)
CHLORIDE: 104 mmol/L (ref 101–111)
CO2: 24 mmol/L (ref 22–32)
CREATININE: 0.84 mg/dL (ref 0.61–1.24)
GFR calc non Af Amer: >60 mL/min (ref >60)
Glucose, Bld: 81 mg/dL (ref 65–99)
Potassium: 4.2 mmol/L (ref 3.5–5.1)
SODIUM: 138 mmol/L (ref 135–145)
Total Bilirubin: 0.5 mg/dL (ref 0.3–1.2)
Total Protein: 6.2 g/dL — ABNORMAL LOW (ref 6.5–8.1)

## 2017-05-30 LAB — CBC
HCT: 40 % (ref 39.0–52.0)
Hemoglobin: 13.5 g/dL (ref 13.0–17.0)
MCH: 34 pg (ref 26.0–34.0)
MCHC: 33.8 g/dL (ref 30.0–36.0)
MCV: 100.8 fL — AB (ref 78.0–100.0)
PLATELETS: 292 10*3/uL (ref 150–400)
RBC: 3.97 MIL/uL — AB (ref 4.22–5.81)
RDW: 12.6 % (ref 11.5–15.5)
WBC: 8.4 10*3/uL (ref 4.0–10.5)

## 2017-05-30 LAB — SURGICAL PCR SCREEN
MRSA, PCR: NEGATIVE
STAPHYLOCOCCUS AUREUS: NEGATIVE

## 2017-06-02 ENCOUNTER — Encounter (HOSPITAL_COMMUNITY)
Admission: RE | Disposition: A | Discharge status: Home / Self Care | Payer: Self-pay | Source: Ambulatory Visit | Attending: Orthopedic Surgery

## 2017-06-02 ENCOUNTER — Ambulatory Visit (HOSPITAL_COMMUNITY): Payer: Self-pay | Admitting: Certified Registered"

## 2017-06-02 ENCOUNTER — Ambulatory Visit (HOSPITAL_COMMUNITY): Payer: Self-pay

## 2017-06-02 ENCOUNTER — Ambulatory Visit (HOSPITAL_COMMUNITY)
Admission: RE | Admit: 2017-06-02 | Discharge: 2017-06-02 | Disposition: A | Discharge status: Home / Self Care | Payer: Self-pay | Source: Ambulatory Visit | Attending: Orthopedic Surgery | Admitting: Orthopedic Surgery

## 2017-06-02 ENCOUNTER — Encounter (HOSPITAL_COMMUNITY): Payer: Self-pay | Admitting: *Deleted

## 2017-06-02 DIAGNOSIS — Z79899 Other long term (current) drug therapy: Secondary | ICD-10-CM | POA: Insufficient documentation

## 2017-06-02 DIAGNOSIS — F419 Anxiety disorder, unspecified: Secondary | ICD-10-CM | POA: Insufficient documentation

## 2017-06-02 DIAGNOSIS — Z419 Encounter for procedure for purposes other than remedying health state, unspecified: Secondary | ICD-10-CM

## 2017-06-02 DIAGNOSIS — I10 Essential (primary) hypertension: Secondary | ICD-10-CM | POA: Insufficient documentation

## 2017-06-02 DIAGNOSIS — F17210 Nicotine dependence, cigarettes, uncomplicated: Secondary | ICD-10-CM | POA: Insufficient documentation

## 2017-06-02 DIAGNOSIS — S82841A Displaced bimalleolar fracture of right lower leg, initial encounter for closed fracture: Secondary | ICD-10-CM | POA: Insufficient documentation

## 2017-06-02 DIAGNOSIS — F319 Bipolar disorder, unspecified: Secondary | ICD-10-CM | POA: Insufficient documentation

## 2017-06-02 DIAGNOSIS — W19XXXA Unspecified fall, initial encounter: Secondary | ICD-10-CM | POA: Insufficient documentation

## 2017-06-02 HISTORY — PX: ORIF ANKLE FRACTURE: SHX5408

## 2017-06-02 SURGERY — OPEN REDUCTION INTERNAL FIXATION (ORIF) ANKLE FRACTURE
Anesthesia: General | Laterality: Right

## 2017-06-02 MED ORDER — LACTATED RINGERS IV SOLN: INTRAVENOUS | Status: DC

## 2017-06-02 MED ORDER — ONDANSETRON HCL 4 MG/2ML IJ SOLN
INTRAMUSCULAR | Status: DC | PRN
  Administered 2017-06-02: 4 mg via INTRAVENOUS

## 2017-06-02 MED ORDER — PROPOFOL 10 MG/ML IV BOLUS
INTRAVENOUS | Status: AC
  Filled 2017-06-02: qty 20

## 2017-06-02 MED ORDER — FENTANYL CITRATE (PF) 100 MCG/2ML IJ SOLN
INTRAMUSCULAR | Status: AC
  Administered 2017-06-02: 50 ug
  Filled 2017-06-02: qty 2

## 2017-06-02 MED ORDER — CHLORHEXIDINE GLUCONATE 4 % EX LIQD: 60.0000 mL | Freq: Once | CUTANEOUS | Status: DC

## 2017-06-02 MED ORDER — PROMETHAZINE HCL 25 MG/ML IJ SOLN: 6.2500 mg | INTRAMUSCULAR | Status: DC | PRN

## 2017-06-02 MED ORDER — BUPIVACAINE-EPINEPHRINE (PF) 0.5% -1:200000 IJ SOLN
INTRAMUSCULAR | Status: DC | PRN
  Administered 2017-06-02: 20 mL via PERINEURAL
  Administered 2017-06-02: 30 mL via PERINEURAL

## 2017-06-02 MED ORDER — MIDAZOLAM HCL 2 MG/2ML IJ SOLN
INTRAMUSCULAR | Status: AC
  Administered 2017-06-02: 2 mg
  Filled 2017-06-02: qty 2

## 2017-06-02 MED ORDER — ROCURONIUM BROMIDE 10 MG/ML (PF) SYRINGE
PREFILLED_SYRINGE | INTRAVENOUS | Status: AC
  Filled 2017-06-02: qty 5

## 2017-06-02 MED ORDER — ASPIRIN EC 325 MG PO TBEC: 325.0000 mg | DELAYED_RELEASE_TABLET | Freq: Two times a day (BID) | ORAL | 0 refills | Status: AC

## 2017-06-02 MED ORDER — CEFAZOLIN SODIUM-DEXTROSE 2-4 GM/100ML-% IV SOLN
INTRAVENOUS | Status: AC
  Filled 2017-06-02: qty 100

## 2017-06-02 MED ORDER — CEFAZOLIN SODIUM-DEXTROSE 2-4 GM/100ML-% IV SOLN
2.0000 g | INTRAVENOUS | Status: AC
  Administered 2017-06-02: 2 g via INTRAVENOUS

## 2017-06-02 MED ORDER — FENTANYL CITRATE (PF) 250 MCG/5ML IJ SOLN
INTRAMUSCULAR | Status: AC
  Filled 2017-06-02: qty 5

## 2017-06-02 MED ORDER — LIDOCAINE 2% (20 MG/ML) 5 ML SYRINGE
INTRAMUSCULAR | Status: AC
  Filled 2017-06-02: qty 5

## 2017-06-02 MED ORDER — OXYCODONE HCL 5 MG PO TABS
5.0000 mg | ORAL_TABLET | ORAL | 0 refills | Status: AC | PRN
Start: 2017-06-02 — End: 2018-06-02

## 2017-06-02 MED ORDER — LIDOCAINE 2% (20 MG/ML) 5 ML SYRINGE
INTRAMUSCULAR | Status: DC | PRN
  Administered 2017-06-02: 80 mg via INTRAVENOUS

## 2017-06-02 MED ORDER — ONDANSETRON 4 MG PO TBDP: 4.0000 mg | ORAL_TABLET | Freq: Three times a day (TID) | ORAL | 0 refills | Status: DC | PRN

## 2017-06-02 MED ORDER — DEXAMETHASONE SODIUM PHOSPHATE 4 MG/ML IJ SOLN
INTRAMUSCULAR | Status: DC | PRN
  Administered 2017-06-02: 10 mg via INTRAVENOUS

## 2017-06-02 MED ORDER — LACTATED RINGERS IV SOLN
INTRAVENOUS | Status: DC | PRN
  Administered 2017-06-02: 12:00:00 via INTRAVENOUS

## 2017-06-02 MED ORDER — FENTANYL CITRATE (PF) 100 MCG/2ML IJ SOLN: 25.0000 ug | INTRAMUSCULAR | Status: DC | PRN

## 2017-06-02 MED ORDER — MIDAZOLAM HCL 2 MG/2ML IJ SOLN
INTRAMUSCULAR | Status: AC
  Filled 2017-06-02: qty 2

## 2017-06-02 MED ORDER — DEXMEDETOMIDINE HCL IN NACL 200 MCG/50ML IV SOLN
INTRAVENOUS | Status: DC | PRN
  Administered 2017-06-02: 12 ug via INTRAVENOUS

## 2017-06-02 MED ORDER — LACTATED RINGERS IV SOLN
Freq: Once | INTRAVENOUS | Status: AC
  Administered 2017-06-02: 50 mL/h via INTRAVENOUS

## 2017-06-02 MED ORDER — PROPOFOL 10 MG/ML IV BOLUS
INTRAVENOUS | Status: DC | PRN
  Administered 2017-06-02: 30 mg via INTRAVENOUS
  Administered 2017-06-02: 200 mg via INTRAVENOUS
  Administered 2017-06-02: 100 mg via INTRAVENOUS

## 2017-06-02 MED ORDER — 0.9 % SODIUM CHLORIDE (POUR BTL) OPTIME
TOPICAL | Status: DC | PRN
  Administered 2017-06-02: 1000 mL

## 2017-06-02 MED ORDER — ONDANSETRON HCL 4 MG/2ML IJ SOLN
INTRAMUSCULAR | Status: AC
  Filled 2017-06-02: qty 2

## 2017-06-02 SURGICAL SUPPLY — 64 items
BANDAGE ACE 4X5 VEL STRL LF (GAUZE/BANDAGES/DRESSINGS) IMPLANT
BANDAGE ACE 6X5 VEL STRL LF (GAUZE/BANDAGES/DRESSINGS) ×2 IMPLANT
BANDAGE ELASTIC 4 VELCRO ST LF (GAUZE/BANDAGES/DRESSINGS) ×2 IMPLANT
BANDAGE ESMARK 6X9 LF (GAUZE/BANDAGES/DRESSINGS) IMPLANT
BIT DRILL 2.5 X LONG (BIT) ×1
BIT DRILL X LONG 2.5 (BIT) IMPLANT
BNDG CMPR 9X6 STRL LF SNTH (GAUZE/BANDAGES/DRESSINGS)
BNDG COHESIVE 4X5 TAN STRL (GAUZE/BANDAGES/DRESSINGS) ×3 IMPLANT
BNDG ESMARK 6X9 LF (GAUZE/BANDAGES/DRESSINGS)
CANISTER SUCT 3000ML PPV (MISCELLANEOUS) ×3 IMPLANT
COVER SURGICAL LIGHT HANDLE (MISCELLANEOUS) ×3 IMPLANT
CUFF TOURNIQUET SINGLE 34IN LL (TOURNIQUET CUFF) ×2 IMPLANT
DRAPE C-ARM 42X72 X-RAY (DRAPES) ×3 IMPLANT
DRAPE C-ARMOR (DRAPES) ×3 IMPLANT
DRAPE U-SHAPE 47X51 STRL (DRAPES) ×3 IMPLANT
DRILL BIT X LONG 2.5 (BIT) ×3
DRSG ADAPTIC 3X8 NADH LF (GAUZE/BANDAGES/DRESSINGS) ×3 IMPLANT
DRSG PAD ABDOMINAL 8X10 ST (GAUZE/BANDAGES/DRESSINGS) ×3 IMPLANT
DURAPREP 26ML APPLICATOR (WOUND CARE) ×5 IMPLANT
ELECT REM PT RETURN 9FT ADLT (ELECTROSURGICAL) ×3
ELECTRODE REM PT RTRN 9FT ADLT (ELECTROSURGICAL) ×1 IMPLANT
GAUZE SPONGE 4X4 12PLY STRL (GAUZE/BANDAGES/DRESSINGS) ×3 IMPLANT
GLOVE BIO SURGEON STRL SZ7.5 (GLOVE) ×3 IMPLANT
GLOVE BIOGEL PI IND STRL 8 (GLOVE) ×1 IMPLANT
GLOVE BIOGEL PI INDICATOR 8 (GLOVE) ×2
GOWN STRL REUS W/ TWL LRG LVL3 (GOWN DISPOSABLE) ×2 IMPLANT
GOWN STRL REUS W/ TWL XL LVL3 (GOWN DISPOSABLE) ×1 IMPLANT
GOWN STRL REUS W/TWL LRG LVL3 (GOWN DISPOSABLE) ×6
GOWN STRL REUS W/TWL XL LVL3 (GOWN DISPOSABLE) ×3
KIT BASIN OR (CUSTOM PROCEDURE TRAY) ×3 IMPLANT
KIT ROOM TURNOVER OR (KITS) ×3 IMPLANT
NDL HYPO 25GX1X1/2 BEV (NEEDLE) IMPLANT
NEEDLE HYPO 25GX1X1/2 BEV (NEEDLE) IMPLANT
NS IRRIG 1000ML POUR BTL (IV SOLUTION) ×3 IMPLANT
PACK ORTHO EXTREMITY (CUSTOM PROCEDURE TRAY) ×3 IMPLANT
PAD ARMBOARD 7.5X6 YLW CONV (MISCELLANEOUS) ×6 IMPLANT
PAD CAST 4YDX4 CTTN HI CHSV (CAST SUPPLIES) ×2 IMPLANT
PADDING CAST COTTON 4X4 STRL (CAST SUPPLIES) ×3
PADDING CAST COTTON 6X4 STRL (CAST SUPPLIES) ×3 IMPLANT
PLATE LCP 3.5 1/3 TUB 7HX81 (Plate) ×2 IMPLANT
SCREW CANC FT/18 4.0 (Screw) ×4 IMPLANT
SCREW CANN L THRD/40 4.0 (Screw) ×4 IMPLANT
SCREW CORTEX 3.5 12MM (Screw) ×2 IMPLANT
SCREW CORTEX 3.5 14MM (Screw) ×2 IMPLANT
SCREW CORTEX 3.5 18MM (Screw) ×2 IMPLANT
SCREW CORTEX 3.5 24MM (Screw) ×2 IMPLANT
SCREW LOCK CORT ST 3.5X12 (Screw) IMPLANT
SCREW LOCK CORT ST 3.5X14 (Screw) IMPLANT
SCREW LOCK CORT ST 3.5X18 (Screw) IMPLANT
SCREW LOCK CORT ST 3.5X24 (Screw) IMPLANT
SPLINT FIBERGLASS 4X30 (CAST SUPPLIES) ×2 IMPLANT
SPONGE LAP 18X18 X RAY DECT (DISPOSABLE) IMPLANT
SUCTION FRAZIER HANDLE 10FR (MISCELLANEOUS) ×2
SUCTION TUBE FRAZIER 10FR DISP (MISCELLANEOUS) ×1 IMPLANT
SUT ETHILON 3 0 PS 1 (SUTURE) ×7 IMPLANT
SUT VIC AB 0 CT1 27 (SUTURE)
SUT VIC AB 0 CT1 27XBRD ANBCTR (SUTURE) IMPLANT
SUT VIC AB 2-0 CT1 27 (SUTURE) ×6
SUT VIC AB 2-0 CT1 TAPERPNT 27 (SUTURE) ×1 IMPLANT
SYR CONTROL 10ML LL (SYRINGE) IMPLANT
TOWEL OR 17X26 10 PK STRL BLUE (TOWEL DISPOSABLE) ×6 IMPLANT
TUBE CONNECTING 12'X1/4 (SUCTIONS) ×1
TUBE CONNECTING 12X1/4 (SUCTIONS) ×2 IMPLANT
YANKAUER SUCT BULB TIP NO VENT (SUCTIONS) ×3 IMPLANT

## 2017-06-02 NOTE — Progress Notes (Signed)
Took report for primary care giver

## 2017-06-02 NOTE — Anesthesia Procedure Notes (Signed)
Anesthesia Regional Block: Adductor canal block   Pre-Anesthetic Checklist: ,, timeout performed, Correct Patient, Correct Site, Correct Laterality, Correct Procedure, Correct Position, site marked, Risks and benefits discussed,  Surgical consent,  Pre-op evaluation,  At surgeon's request and post-op pain management  Laterality: Right  Prep: chloraprep       Needles:  Injection technique: Single-shot  Needle Type: Echogenic Needle     Needle Length: 10cm  Needle Gauge: 21     Additional Needles:   Narrative:  Start time: 06/02/2017 12:01 PM End time: 06/02/2017 12:04 PM Injection made incrementally with aspirations every 5 mL.  Performed by: Personally  Anesthesiologist: Beryle LatheBrock, Alishia Lebo E, MD  Additional Notes: No pain on injection. No increased resistance to injection. Injection made in 5cc increments. Good needle visualization. Patient tolerated the procedure well.

## 2017-06-02 NOTE — Transfer of Care (Signed)
Immediate Anesthesia Transfer of Care Note  Patient: [name] Brady  Procedure(s) Performed: OPEN REDUCTION INTERNAL FIXATION (ORIF) ANKLE FRACTURE (Right )  Patient Location: PACU  Anesthesia Type:GA combined with regional for post-op pain  Level of Consciousness: awake, alert  and oriented  Airway & Oxygen Therapy: Patient Spontanous Breathing and Patient connected to nasal cannula oxygen  Post-op Assessment: Report given to RN, Post -op Vital signs reviewed and stable and Patient moving all extremities  Post vital signs: Reviewed and stable  Last Vitals:  Vitals:   06/02/17 0931 06/02/17 1215  BP: (!) 144/94 (!) 144/94  Pulse: (!) 57 66  Resp: 18 11  Temp: 36.6 C   SpO2: 98% 100%    Last Pain:  Vitals:   06/02/17 1215  TempSrc:   PainSc: 4          Complications: No apparent anesthesia complications

## 2017-06-02 NOTE — Anesthesia Preprocedure Evaluation (Addendum)
Anesthesia Evaluation  Patient identified by MRN, date of birth, ID band Patient awake    Reviewed: Allergy & Precautions, NPO status , Patient's Chart, lab work & pertinent test results  Airway Mallampati: II  TM Distance: >3 FB Neck ROM: Full    Dental  (+) Dental Advisory Given   Pulmonary Current Smoker,    Pulmonary exam normal breath sounds clear to auscultation       Cardiovascular hypertension, Pt. on medications and Pt. on home beta blockers Normal cardiovascular exam Rhythm:Regular Rate:Normal     Neuro/Psych Anxiety Depression Bipolar Disorder negative neurological ROS     GI/Hepatic negative GI ROS, (+)     substance abuse  alcohol use, cocaine use and marijuana use,   Endo/Other  negative endocrine ROS  Renal/GU negative Renal ROS  negative genitourinary   Musculoskeletal negative musculoskeletal ROS (+)   Abdominal   Peds  (+) ATTENTION DEFICIT DISORDER WITHOUT HYPERACTIVITY Hematology negative hematology ROS (+)   Anesthesia Other Findings   Reproductive/Obstetrics                            Anesthesia Physical Anesthesia Plan  ASA: III  Anesthesia Plan: General   Post-op Pain Management:  Regional for Post-op pain   Induction: Intravenous  PONV Risk Score and Plan: 3 and Treatment may vary due to age or medical condition, Ondansetron, Dexamethasone and Midazolam  Airway Management Planned: LMA  Additional Equipment: None  Intra-op Plan:   Post-operative Plan: Extubation in OR  Informed Consent: I have reviewed the patients History and Physical, chart, labs and discussed the procedure including the risks, benefits and alternatives for the proposed anesthesia with the patient or authorized representative who has indicated his/her understanding and acceptance.   Dental advisory given  Plan Discussed with: CRNA  Anesthesia Plan Comments:         Anesthesia Quick Evaluation

## 2017-06-02 NOTE — Progress Notes (Signed)
Dr. Acey Lavarignan notified of patient drinking 2 cups black coffee. (stated was okay) per nurse Maralyn SagoSarah.

## 2017-06-02 NOTE — Anesthesia Procedure Notes (Signed)
Procedure Name: LMA Insertion Date/Time: 06/02/2017 12:34 PM Performed by: Lucinda Dellecarlo, Jd Mccaster M, CRNA Pre-anesthesia Checklist: Patient identified, Emergency Drugs available, Suction available and Patient being monitored Patient Re-evaluated:Patient Re-evaluated prior to induction Oxygen Delivery Method: Circle System Utilized Preoxygenation: Pre-oxygenation with 100% oxygen Induction Type: IV induction Ventilation: Mask ventilation without difficulty LMA: LMA inserted LMA Size: 5.0 Number of attempts: 1 Placement Confirmation: positive ETCO2 Tube secured with: Tape Dental Injury: Teeth and Oropharynx as per pre-operative assessment

## 2017-06-02 NOTE — Brief Op Note (Signed)
06/02/2017  2:06 PM  PATIENT:  [name] Brady  42 y.o. male  PRE-OPERATIVE DIAGNOSIS:  Right ankle fracture bimalleolar  POST-OPERATIVE DIAGNOSIS:  Right ankle fracture bimalleolar  PROCEDURE:  Procedure(s) with comments: OPEN REDUCTION INTERNAL FIXATION (ORIF) ANKLE FRACTURE (Right) - 90 mins  SURGEON:  Surgeon(s) and Role:    * Aundria Rudogers, Noah DelaineJason Patrick, MD - Primary  PHYSICIAN ASSISTANT:   ASSISTANTS: none   ANESTHESIA:   regional and general  EBL:  25 mL   BLOOD ADMINISTERED:none  DRAINS: none   LOCAL MEDICATIONS USED:  NONE  SPECIMEN:  No Specimen  DISPOSITION OF SPECIMEN:  N/A  COUNTS:  YES  TOURNIQUET:   Total Tourniquet Time Documented: Thigh (Right) - 55 minutes Total: Thigh (Right) - 55 minutes   DICTATION: .Note written in EPIC  PLAN OF CARE: Discharge to home after PACU  PATIENT DISPOSITION:  PACU - hemodynamically stable.   Delay start of Pharmacological VTE agent (>24hrs) due to surgical blood loss or risk of bleeding: not applicable

## 2017-06-02 NOTE — Anesthesia Procedure Notes (Signed)
Anesthesia Regional Block: Popliteal block   Pre-Anesthetic Checklist: ,, timeout performed, Correct Patient, Correct Site, Correct Laterality, Correct Procedure, Correct Position, site marked, Risks and benefits discussed,  Surgical consent,  Pre-op evaluation,  At surgeon's request and post-op pain management  Laterality: Right  Prep: chloraprep       Needles:  Injection technique: Single-shot  Needle Type: Echogenic Needle     Needle Length: 10cm  Needle Gauge: 21     Additional Needles:   Narrative:  Start time: 06/02/2017 12:05 PM End time: 06/02/2017 12:09 PM Injection made incrementally with aspirations every 5 mL.  Performed by: Personally  Anesthesiologist: Beryle LatheBrock, Tomeika Weinmann E, MD  Additional Notes: No pain on injection. No increased resistance to injection. Injection made in 5cc increments. Good needle visualization. Patient tolerated the procedure well.

## 2017-06-02 NOTE — H&P (Signed)
ORTHOPAEDIC H and P  REQUESTING PHYSICIAN: Yolonda Kidaogers, Bertina Guthridge Patrick, MD  PCP:  Darrin Nipperollege, [facility] [place]  Chief Complaint: Right ankle fracture  HPI: [name] Brady is a 42 y.o. male who complains of right ankle fracture following a fall just under 2 weeks ago.  We had followed him up in the office last week where we discussed moving forward with operative management.  He has no new complaints today and no questions.  He has been compliant with elevation and nonweightbearing.  Past Medical History:  Diagnosis Date  . Anxiety   . Bipolar disorder (HCC)   . Family history of anesthesia complication    difficult for father to wake up"  . Hypertension    Past Surgical History:  Procedure Laterality Date  . KNEE ARTHROSCOPY Right   . WISDOM TOOTH EXTRACTION    . WRIST SURGERY Right    Social History   Socioeconomic History  . Marital status: Single    Spouse name: None  . Number of children: None  . Years of education: None  . Highest education level: None  Social Needs  . Financial resource strain: None  . Food insecurity - worry: None  . Food insecurity - inability: None  . Transportation needs - medical: None  . Transportation needs - non-medical: None  Occupational History  . None  Tobacco Use  . Smoking status: Current Every Day Smoker    Packs/day: 1.00    Years: 18.00    Pack years: 18.00    Types: Cigarettes  . Smokeless tobacco: Never Used  Substance and Sexual Activity  . Alcohol use: No    Frequency: Never    Comment: former user  . 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 Sexual activity: None  Other Topics Concern  . None  Social History Narrative  . None   History reviewed. No pertinent family history. No Known Allergies Prior to Admission medications   Medication Sig Start Date End Date Taking? Authorizing Provider  atenolol (TENORMIN) 50 MG tablet Take 50 mg by mouth  daily.   Yes [provider]  buPROPion (WELLBUTRIN) 100 MG tablet Take 100 mg by mouth daily.   Yes [provider]  carbamazepine (TEGRETOL) 200 MG tablet Take 200 mg by mouth 3 (three) times daily.   Yes [provider]  HYDROcodone-acetaminophen (NORCO/VICODIN) 5-325 MG tablet Take 1 tablet by mouth every 6 (six) hours as needed. 05/16/17  Yes Murray, Alyssa B, PA-C   No results found.  Positive ROS: All other systems have been reviewed and were otherwise negative with the exception of those mentioned in the HPI and as above.  Physical Exam: General: Alert, no acute distress Cardiovascular: No pedal edema Respiratory: No cyanosis, no use of accessory musculature GI: No organomegaly, abdomen is soft and non-tender Skin: No lesions in the area of chief complaint Neurologic: Sensation intact distally Psychiatric: Patient is competent for consent with normal mood and affect Lymphatic: No axillary or cervical lymphadenopathy  MUSCULOSKELETAL:   Short leg splint in place with toes that are warm and well-perfused with no pain with passive stretch.  Active motion intact.  Sensation intact deep and superficial peroneal nerve.  No signs of increased swelling.  Assessment: Right ankle bimalleolar fracture, closed.  Plan: -Plan for operative fixation of his bony bimalleolar fracture.  We will discharge home from PACU postoperatively.  He will remain nonweightbearing for  approximately 6 weeks postoperatively. -We again reviewed the risk, benefits, and indications of the procedure in the office as well as soliciting any questions here in the preoperative holding area.  He has no new questions today and he did provide informed consent to proceed with surgery.    Yolonda Kida, MD Cell [phone]    06/02/2017 12:06 PM

## 2017-06-02 NOTE — Op Note (Signed)
   Date of Surgery: 06/02/2017  INDICATIONS: [name] is a 42 y.o.-year-old male who sustained a right ankle fracture; he was indicated for open reduction and internal fixation due to the displaced nature of the articular fracture and came to the operating room today for this procedure. The patient did consent to the procedure after discussion of the risks and benefits.  PREOPERATIVE DIAGNOSIS: right bimalleolar ankle fracture closed.  POSTOPERATIVE DIAGNOSIS: Same.  PROCEDURE: Open treatment of right ankle fracture with internal fixation. Bimalleolar CPT [id number];  SURGEON: Winona Sison P. Aundria Rudogers, M.D.  ASSIST: None.  ANESTHESIA:  general, and regional  TOURNIQUET TIME: 53 minutes  IV FLUIDS AND URINE: See anesthesia.  ESTIMATED BLOOD LOSS: 25 mL.  IMPLANTS: Synthes small frag set 8 hole one third tubular plate 2 4.0 mm cannulated screws  COMPLICATIONS: None.  DESCRIPTION OF PROCEDURE: The patient was brought to the operating room and placed supine on the operating table.  The patient had been signed prior to the procedure and this was documented. The patient had the anesthesia placed by the anesthesiologist.  A nonsterile tourniquet was placed on the upper thigh.  The prep verification and incision time-outs were performed to confirm that this was the correct patient, site, side and location. The patient had an SCD on the opposite lower extremity. The patient did receive antibiotics prior to the incision and was re-dosed during the procedure as needed at indicated intervals.  The patient had the lower extremity prepped and draped in the standard surgical fashion.  The extremity was exsanguinated using an esmarch bandage and the tourniquet was inflated to 350 mm Hg.   Incision was made over the distal fibula and the fracture was exposed and reduced anatomically with a clamp. A lag screw was placed. I then applied a 1/3 tubular locking plate and secured it proximally and distally with  non-locking screws. Bone quality was good. I used c-arm to confirm satisfactory reduction and fixation.   I then turned my attention to the medial malleolus. Incision was made over the medial malleolus and the fracture exposed and held provisionally with a clamp. 2 guidepins were placed for the 4.0 mm cannulated screws and then confirmation of reduction was made with fluoroscopy. I then placed 2  40mm screws which had satisfactory fixation.   The syndesmosis was stressed using live fluoroscopy and found to be stable.  I then performed AP, lateral, mortise fluoroscopic images that confirmed correct placement of plate and screws as well as adequacy of the reduction.  The wounds were irrigated, and closed with vicryl with routine closure for the skin. The wounds were injected with local anesthetic. Sterile gauze was applied followed by a posterior splint. He was awakened and returned to the PACU in stable and satisfactory condition. There were no complications.  All counts were correct x2.     POSTOPERATIVE PLAN: [name] will remain nonweightbearing on this leg for approximately 6 weeks; [name] will return for suture removal in 2 weeks.  He will be immobilized in a short leg splint and then transitioned to a CAM walker at his first follow up appointment.  [name] will receive DVT prophylaxis based on other medications, activity level, and risk ratio of bleeding to thrombosis.  [name] RuedJason P Jamori Biggar, MD [facility] [phone] 2:22 PM

## 2017-06-02 NOTE — Discharge Instructions (Signed)
Discharge instructions  -Non weight bearing right lower extremity -Maintain splint at all times and do not get wet.  -Maintain elevated right lower extremity with "toes above nose." -For the prevention of blood clots take an aspirin twice daily.  Do this for 6 weeks. Return to see Dr. Aundria Rudogers in 2 weeks for splint removal and suture removal.-   -For mild to moderate pain take Tylenol.  For moderate to severe pain take oxycodone as prescribed.

## 2017-06-02 NOTE — Anesthesia Postprocedure Evaluation (Signed)
Anesthesia Post Note  Patient: [name] Brady  Procedure(s) Performed: OPEN REDUCTION INTERNAL FIXATION (ORIF) ANKLE FRACTURE (Right )     Patient location during evaluation: PACU Anesthesia Type: General Level of consciousness: awake and alert Pain management: pain level controlled Vital Signs Assessment: post-procedure vital signs reviewed and stable Respiratory status: spontaneous breathing, nonlabored ventilation and respiratory function stable Cardiovascular status: blood pressure returned to baseline and stable Postop Assessment: no apparent nausea or vomiting Anesthetic complications: no    Last Vitals:  Vitals:   06/02/17 1411 06/02/17 1445  BP:  130/86  Pulse:  (!) 52  Resp:  (!) 24  Temp: 36.7 C   SpO2:  100%    Last Pain:  Vitals:   06/02/17 1445  TempSrc:   PainSc: 0-No pain                 Beryle Lathehomas E Brock

## 2017-06-06 ENCOUNTER — Encounter (HOSPITAL_COMMUNITY): Payer: Self-pay | Admitting: Orthopedic Surgery

## 2020-03-07 ENCOUNTER — Encounter (HOSPITAL_COMMUNITY): Payer: Self-pay | Admitting: Emergency Medicine

## 2020-03-07 ENCOUNTER — Emergency Department (HOSPITAL_COMMUNITY): Payer: 59

## 2020-03-07 ENCOUNTER — Emergency Department (HOSPITAL_COMMUNITY)
Admission: EM | Admit: 2020-03-07 | Discharge: 2020-03-07 | Disposition: A | Discharge status: Home / Self Care | Payer: 59 | Attending: Emergency Medicine | Admitting: Emergency Medicine

## 2020-03-07 ENCOUNTER — Other Ambulatory Visit: Payer: Self-pay

## 2020-03-07 DIAGNOSIS — S8001XA Contusion of right knee, initial encounter: Secondary | ICD-10-CM | POA: Insufficient documentation

## 2020-03-07 DIAGNOSIS — F17210 Nicotine dependence, cigarettes, uncomplicated: Secondary | ICD-10-CM | POA: Diagnosis not present

## 2020-03-07 DIAGNOSIS — Y92019 Unspecified place in single-family (private) house as the place of occurrence of the external cause: Secondary | ICD-10-CM | POA: Diagnosis not present

## 2020-03-07 DIAGNOSIS — R112 Nausea with vomiting, unspecified: Secondary | ICD-10-CM | POA: Diagnosis not present

## 2020-03-07 DIAGNOSIS — S00212A Abrasion of left eyelid and periocular area, initial encounter: Secondary | ICD-10-CM | POA: Diagnosis not present

## 2020-03-07 DIAGNOSIS — S52532A Colles' fracture of left radius, initial encounter for closed fracture: Secondary | ICD-10-CM | POA: Diagnosis not present

## 2020-03-07 DIAGNOSIS — I10 Essential (primary) hypertension: Secondary | ICD-10-CM | POA: Diagnosis not present

## 2020-03-07 DIAGNOSIS — Z23 Encounter for immunization: Secondary | ICD-10-CM | POA: Insufficient documentation

## 2020-03-07 DIAGNOSIS — S52531A Colles' fracture of right radius, initial encounter for closed fracture: Secondary | ICD-10-CM | POA: Diagnosis not present

## 2020-03-07 DIAGNOSIS — Z79899 Other long term (current) drug therapy: Secondary | ICD-10-CM | POA: Insufficient documentation

## 2020-03-07 DIAGNOSIS — S0081XA Abrasion of other part of head, initial encounter: Secondary | ICD-10-CM

## 2020-03-07 DIAGNOSIS — R42 Dizziness and giddiness: Secondary | ICD-10-CM | POA: Diagnosis not present

## 2020-03-07 DIAGNOSIS — W07XXXA Fall from chair, initial encounter: Secondary | ICD-10-CM | POA: Insufficient documentation

## 2020-03-07 DIAGNOSIS — M542 Cervicalgia: Secondary | ICD-10-CM | POA: Insufficient documentation

## 2020-03-07 DIAGNOSIS — S6991XA Unspecified injury of right wrist, hand and finger(s), initial encounter: Secondary | ICD-10-CM | POA: Diagnosis present

## 2020-03-07 DIAGNOSIS — W19XXXA Unspecified fall, initial encounter: Secondary | ICD-10-CM

## 2020-03-07 DIAGNOSIS — T1490XA Injury, unspecified, initial encounter: Secondary | ICD-10-CM

## 2020-03-07 DIAGNOSIS — Y92009 Unspecified place in unspecified non-institutional (private) residence as the place of occurrence of the external cause: Secondary | ICD-10-CM

## 2020-03-07 LAB — CBC WITH DIFFERENTIAL/PLATELET
Abs Immature Granulocytes: 0.03 10*3/uL (ref 0.00–0.07)
Basophils Absolute: 0 10*3/uL (ref 0.0–0.1)
Basophils Relative: 0 %
Eosinophils Absolute: 0 10*3/uL (ref 0.0–0.5)
Eosinophils Relative: 0 %
HCT: 36 % — ABNORMAL LOW (ref 39.0–52.0)
Hemoglobin: 12.6 g/dL — ABNORMAL LOW (ref 13.0–17.0)
Immature Granulocytes: 0 %
Lymphocytes Relative: 7 %
Lymphs Abs: 0.7 10*3/uL (ref 0.7–4.0)
MCH: 35.9 pg — ABNORMAL HIGH (ref 26.0–34.0)
MCHC: 35 g/dL (ref 30.0–36.0)
MCV: 102.6 fL — ABNORMAL HIGH (ref 80.0–100.0)
Monocytes Absolute: 0.6 10*3/uL (ref 0.1–1.0)
Monocytes Relative: 6 %
Neutro Abs: 9.6 10*3/uL — ABNORMAL HIGH (ref 1.7–7.7)
Neutrophils Relative %: 87 %
Platelets: 189 10*3/uL (ref 150–400)
RBC: 3.51 MIL/uL — ABNORMAL LOW (ref 4.22–5.81)
RDW: 11.9 % (ref 11.5–15.5)
WBC: 11.1 10*3/uL — ABNORMAL HIGH (ref 4.0–10.5)
nRBC: 0 % (ref 0.0–0.2)

## 2020-03-07 LAB — BASIC METABOLIC PANEL
Anion gap: 13 (ref 5–15)
BUN: 19 mg/dL (ref 6–20)
CO2: 22 mmol/L (ref 22–32)
Calcium: 8 mg/dL — ABNORMAL LOW (ref 8.9–10.3)
Chloride: 104 mmol/L (ref 98–111)
Creatinine, Ser: 0.94 mg/dL (ref 0.61–1.24)
GFR, Estimated: >60 mL/min (ref >60)
Glucose, Bld: 90 mg/dL (ref 70–99)
Potassium: 3.8 mmol/L (ref 3.5–5.1)
Sodium: 139 mmol/L (ref 135–145)

## 2020-03-07 MED ORDER — TETANUS-DIPHTH-ACELL PERTUSSIS 5-2.5-18.5 LF-MCG/0.5 IM SUSY
0.5000 mL | PREFILLED_SYRINGE | Freq: Once | INTRAMUSCULAR | Status: AC
  Administered 2020-03-07: 0.5 mL via INTRAMUSCULAR
  Filled 2020-03-07: qty 0.5

## 2020-03-07 MED ORDER — CHLORDIAZEPOXIDE HCL 25 MG PO CAPS: ORAL_CAPSULE | ORAL | 0 refills | Status: DC

## 2020-03-07 MED ORDER — FENTANYL CITRATE (PF) 100 MCG/2ML IJ SOLN
50.0000 ug | Freq: Once | INTRAMUSCULAR | Status: AC
  Administered 2020-03-07: 50 ug via INTRAVENOUS
  Filled 2020-03-07: qty 2

## 2020-03-07 MED ORDER — PROPOFOL 10 MG/ML IV BOLUS
INTRAVENOUS | Status: AC | PRN
  Administered 2020-03-07 (×2): 50 mg via INTRAVENOUS

## 2020-03-07 MED ORDER — CHLORDIAZEPOXIDE HCL 25 MG PO CAPS
50.0000 mg | ORAL_CAPSULE | Freq: Once | ORAL | Status: AC
  Administered 2020-03-07: 50 mg via ORAL
  Filled 2020-03-07: qty 2

## 2020-03-07 MED ORDER — KETAMINE HCL 50 MG/5ML IJ SOSY
1.0000 mg/kg | PREFILLED_SYRINGE | Freq: Once | INTRAMUSCULAR | Status: AC
  Administered 2020-03-07: 76 mg via INTRAVENOUS
  Filled 2020-03-07: qty 10

## 2020-03-07 MED ORDER — CHLORDIAZEPOXIDE HCL 25 MG PO CAPS: 25.0000 mg | ORAL_CAPSULE | Freq: Once | ORAL | Status: DC

## 2020-03-07 MED ORDER — ONDANSETRON HCL 4 MG/2ML IJ SOLN
4.0000 mg | Freq: Once | INTRAMUSCULAR | Status: AC
  Administered 2020-03-07: 4 mg via INTRAVENOUS
  Filled 2020-03-07: qty 2

## 2020-03-07 MED ORDER — HYDROMORPHONE HCL 1 MG/ML IJ SOLN
1.0000 mg | Freq: Once | INTRAMUSCULAR | Status: AC
  Administered 2020-03-07: 1 mg via INTRAVENOUS
  Filled 2020-03-07: qty 1

## 2020-03-07 MED ORDER — KETAMINE HCL 10 MG/ML IJ SOLN
INTRAMUSCULAR | Status: AC | PRN
  Administered 2020-03-07: 25 mg via INTRAVENOUS
  Administered 2020-03-07: 50 mg via INTRAVENOUS

## 2020-03-07 MED ORDER — SODIUM CHLORIDE 0.9 % IV BOLUS
1000.0000 mL | Freq: Once | INTRAVENOUS | Status: AC
  Administered 2020-03-07: 1000 mL via INTRAVENOUS

## 2020-03-07 MED ORDER — OXYCODONE-ACETAMINOPHEN 5-325 MG PO TABS
2.0000 | ORAL_TABLET | Freq: Once | ORAL | Status: AC
  Administered 2020-03-07: 2 via ORAL
  Filled 2020-03-07: qty 2

## 2020-03-07 MED ORDER — PROPOFOL 10 MG/ML IV BOLUS
50.0000 mg | Freq: Once | INTRAVENOUS | Status: AC
  Administered 2020-03-07: 50 mg via INTRAVENOUS
  Filled 2020-03-07: qty 20

## 2020-03-07 MED ORDER — PROPOFOL 500 MG/50ML IV EMUL: INTRAVENOUS | Status: AC | PRN

## 2020-03-07 MED ORDER — OXYCODONE-ACETAMINOPHEN 5-325 MG PO TABS
1.0000 | ORAL_TABLET | Freq: Four times a day (QID) | ORAL | 0 refills | Status: DC | PRN
Start: 2020-03-07 — End: 2020-03-16

## 2020-03-07 NOTE — ED Triage Notes (Signed)
Pt states he fell out of his chair trying to reset his modem and now has abrasions to his forehead and pain in both arms. Alert and oriented.

## 2020-03-07 NOTE — Discharge Instructions (Addendum)
Please call and follow up closely with hand specialist next week for further care.  Take percocet only when pain is severe, otherwise alternate between tylenol and ibuprofen at home for pain.  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

## 2020-03-07 NOTE — Progress Notes (Signed)
Orthopedic Tech Progress Note Patient Details:  [name] Brady 1975-04-23 [id number]  Ortho Devices Type of Ortho Device: Ace wrap, Sugartong splint Ortho Device/Splint Location: applied (B) sugartong splints and arm slings Ortho Device/Splint Interventions: Ordered, Application   Post Interventions Patient Tolerated: Well Instructions Provided: Care of device   Jennye Moccasin 03/07/2020, 6:32 PM

## 2020-03-07 NOTE — ED Provider Notes (Signed)
[facility]-EMERGENCY DEPT Provider Note   CSN: [id number] Arrival date & time: 03/07/20  1611     History Chief Complaint  Patient presents with  . Arm Injury  . Head Injury    [name] Brady is a 44 y.o. male.  The history is provided by the patient. No language interpreter was used.  Arm Injury Head Injury  44 year old male with history of bipolar, anxiety, hypertension, alcohol abuse presenting for evaluation of a fall.  Patient report several hours ago he was standing on a chair to reset his modem that he placed on the ceiling.  In the process he lost control, fell down and struck his head and injured both of his wrist as he tries to extend his arm to break the fall.  He denies any loss of consciousness but reported acute onset of sharp severe pain to both of his wrists.  He also struck his right knee.  He does complain of pain to the back of his neck and mild forehead tenderness.  He does endorse dizziness.  Endorsed nausea without vomiting.  The fall was unwitnessed.  He mention it took a while before he was able to get to the phone to call for help.  He denies any precipitating symptoms prior to the fall.  He cannot recall last tetanus status.  He is fully up-to-date with his Covid vaccination.  Last meal was earlier this morning.  At this time his pain is 10 out of 10 with some tingling sensation to his fingers bilaterally.  He is right-hand dominant.    Past Medical History:  Diagnosis Date  . Anxiety   . Bipolar disorder (HCC)   . Family history of anesthesia complication    difficult for father to wake up"  . Hypertension     Patient Active Problem List   Diagnosis Date Noted  . Bimalleolar fracture of right ankle, closed, initial encounter 06/02/2017  . Alcohol withdrawal (HCC) 08/27/2012  . GAD (generalized anxiety disorder) 08/27/2012  . Alcoholism, chronic (HCC) 08/27/2012  . Depression 08/27/2012  . Suicidal ideation 08/27/2012  . HTN  (hypertension) 08/27/2012  . ADD (attention deficit disorder) 08/27/2012    Past Surgical History:  Procedure Laterality Date  . KNEE ARTHROSCOPY Right   . ORIF ANKLE FRACTURE Right 06/02/2017   Procedure: OPEN REDUCTION INTERNAL FIXATION (ORIF) ANKLE FRACTURE;  Surgeon: Yolonda Kida, MD;  Location: [facility] OR;  Service: Orthopedics;  Laterality: Right;  90 mins  . WISDOM TOOTH EXTRACTION    . WRIST SURGERY Right        History reviewed. No pertinent family history.  Social History   Tobacco Use  . Smoking status: Current Every Day Smoker    Packs/day: 1.00    Years: 18.00    Pack years: 18.00    Types: Cigarettes  . Smokeless tobacco: Never Used  Vaping Use  . Vaping Use: Never used  Substance Use Topics  . Alcohol use: No    Comment: former user  . Drug use: Yes    Types: Marijuana, Cocaine    Comment: quit coaine and still smokes marijuana    Home Medications Prior to Admission medications   Medication Sig Start Date End Date Taking? Authorizing Provider  atenolol (TENORMIN) 50 MG tablet Take 50 mg by mouth daily.    [provider]  buPROPion (WELLBUTRIN) 100 MG tablet Take 100 mg by mouth daily.    [provider]  carbamazepine (TEGRETOL) 200 MG tablet Take 200  mg by mouth 3 (three) times daily.    [provider]  HYDROcodone-acetaminophen (NORCO/VICODIN) 5-325 MG tablet Take 1 tablet by mouth every 6 (six) hours as needed. 05/16/17   Aviva Kluver B, PA-C  ondansetron (ZOFRAN ODT) 4 MG disintegrating tablet Take 1 tablet (4 mg total) by mouth every 8 (eight) hours as needed. 06/02/17   Yolonda Kida, MD    Allergies    Patient has no known allergies.  Review of Systems   Review of Systems  All other systems reviewed and are negative.   Physical Exam Updated Vital Signs BP 109/69 (BP Location: Right Arm)   Pulse 68   Temp 97.8 F (36.6 C) (Oral)   Resp 18   Ht 5\' 11"  (1.803 m)   Wt 75.8 kg   SpO2 98%   BMI  23.29 kg/m   Physical Exam Vitals and nursing note reviewed.  Constitutional:      General: He is not in acute distress.    Appearance: He is well-developed.  HENT:     Head: Normocephalic.     Comments: Abrasion noted to left forehead and left orbital region with tenderness to palpation but no crepitus.  No battle signs, no raccoon's eyes, no midface tenderness and no obvious scalp tenderness. Eyes:     Extraocular Movements: Extraocular movements intact.     Conjunctiva/sclera: Conjunctivae normal.     Pupils: Pupils are equal, round, and reactive to light.  Cardiovascular:     Rate and Rhythm: Normal rate and regular rhythm.     Pulses: Normal pulses.     Heart sounds: Normal heart sounds.  Pulmonary:     Effort: Pulmonary effort is normal.     Breath sounds: Normal breath sounds.  Abdominal:     Palpations: Abdomen is soft.     Tenderness: There is no abdominal tenderness.  Musculoskeletal:        General: Deformity (Bilateral wrists tender with close deformity appreciated with dorsal angulation.  Radial pulse 2+, sensation intact distally.) present.     Cervical back: Normal range of motion and neck supple. Tenderness (Tenderness along the cervical spine most significant at C3-C4) present.     Comments: Bilateral elbow and shoulder nontender to palpation.  Right anterior knee with contusion and tenderness but normal knee flexion and extension.  Skin:    Findings: No rash.  Neurological:     Mental Status: He is alert and oriented to person, place, and time.     GCS: GCS eye subscore is 4. GCS verbal subscore is 5. GCS motor subscore is 6.     Cranial Nerves: Cranial nerves are intact.     Sensory: Sensation is intact.     Motor: Motor function is intact.  Psychiatric:        Mood and Affect: Mood normal.     ED Results / Procedures / Treatments   Labs (all labs ordered are listed, but only abnormal results are displayed) Labs Reviewed  BASIC METABOLIC PANEL -  Abnormal; Notable for the following components:      Result Value   Calcium 8.0 (*)    All other components within normal limits  CBC WITH DIFFERENTIAL/PLATELET - Abnormal; Notable for the following components:   WBC 11.1 (*)    RBC 3.51 (*)    Hemoglobin 12.6 (*)    HCT 36.0 (*)    MCV 102.6 (*)    MCH 35.9 (*)    Neutro Abs 9.6 (*)  All other components within normal limits    EKG None  Radiology DG Forearm Left  Result Date: 03/07/2020 CLINICAL DATA:  Post reduction EXAM: LEFT FOREARM - 2 VIEW COMPARISON:  Radiograph same day FINDINGS: There is interval reduction of the comminuted impacted intra-articular distal radius fracture with slight dorsal angulation. Overlying soft tissue swelling is seen. IMPRESSION: Improved alignment of the comminuted intra-articular distal radius fracture with slight dorsal angulation. Electronically Signed   By: Jonna Clark M.D.   On: 03/07/2020 19:11   DG Forearm Left  Result Date: 03/07/2020 CLINICAL DATA:  Pain after trauma EXAM: LEFT FOREARM - 2 VIEW COMPARISON:  None. FINDINGS: There is a comminuted angulated fracture of the distal radial metaphysis. The fracture appears to extend into the radiocarpal joint. The carpal bones are grossly intact on limited imaging. IMPRESSION: Comminuted angulated fracture through the distal radius extending into the radiocarpal joint. Electronically Signed   By: Gerome Sam III M.D   On: 03/07/2020 17:13   DG Forearm Right  Result Date: 03/07/2020 CLINICAL DATA:  Postreduction EXAM: RIGHT FOREARM - 2 VIEW COMPARISON:  None. FINDINGS: There is interval reduction of the comminuted impacted distal radius fracture with improved alignment. There is a mildly displaced ulnar styloid fracture. Overlying soft tissue swelling is seen. IMPRESSION: Interval reduction of the comminuted impacted distal radius fracture. Electronically Signed   By: Jonna Clark M.D.   On: 03/07/2020 19:12   DG Forearm Right  Result  Date: 03/07/2020 CLINICAL DATA:  Pain after fall EXAM: RIGHT FOREARM - 2 VIEW COMPARISON:  None. FINDINGS: There is a comminuted displaced fracture with angulation through the distal radius. IMPRESSION: There is a comminuted displaced angulated fracture through the distal radius. Electronically Signed   By: Gerome Sam III M.D   On: 03/07/2020 17:15   CT HEAD WO CONTRAST  Result Date: 03/07/2020 CLINICAL DATA:  Posterior neck pain, dizziness.  Fall EXAM: CT HEAD WITHOUT CONTRAST CT CERVICAL SPINE WITHOUT CONTRAST TECHNIQUE: Multidetector CT imaging of the head and cervical spine was performed following the standard protocol without intravenous contrast. Multiplanar CT image reconstructions of the cervical spine were also generated. COMPARISON:  08/31/2004 FINDINGS: CT HEAD FINDINGS Brain: No evidence of acute infarction, hemorrhage, hydrocephalus, extra-axial collection or mass lesion/mass effect. Vascular: No hyperdense vessel or unexpected calcification. Skull: Normal. Negative for fracture or focal lesion. Sinuses/Orbits: No acute finding. Other: None. CT CERVICAL SPINE FINDINGS Alignment: Facet joints are aligned without dislocation or traumatic listhesis. Dens and lateral masses are aligned. Straightening of the cervical lordosis. Skull base and vertebrae: No acute fracture. No primary bone lesion or focal pathologic process. Soft tissues and spinal canal: No prevertebral fluid or swelling. No visible canal hematoma. Disc levels: Disc height loss and endplate spurring most pronounced at C4-5 through C6-7 where there is at least mild canal stenosis at these levels. Minimal facet arthropathy. Upper chest: Visualized lung apices are clear. Other: None. IMPRESSION: 1. No acute intracranial findings. 2. No evidence of acute fracture or traumatic listhesis of the cervical spine. 3. Mild multilevel degenerative disc disease and facet arthropathy of the cervical spine, most pronounced at C4-5 through C6-7  where there is at least mild canal stenosis at these levels. Electronically Signed   By: Duanne Guess D.O.   On: 03/07/2020 17:57   CT CERVICAL SPINE WO CONTRAST  Result Date: 03/07/2020 CLINICAL DATA:  Posterior neck pain, dizziness.  Fall EXAM: CT HEAD WITHOUT CONTRAST CT CERVICAL SPINE WITHOUT CONTRAST TECHNIQUE: Multidetector CT imaging of  the head and cervical spine was performed following the standard protocol without intravenous contrast. Multiplanar CT image reconstructions of the cervical spine were also generated. COMPARISON:  08/31/2004 FINDINGS: CT HEAD FINDINGS Brain: No evidence of acute infarction, hemorrhage, hydrocephalus, extra-axial collection or mass lesion/mass effect. Vascular: No hyperdense vessel or unexpected calcification. Skull: Normal. Negative for fracture or focal lesion. Sinuses/Orbits: No acute finding. Other: None. CT CERVICAL SPINE FINDINGS Alignment: Facet joints are aligned without dislocation or traumatic listhesis. Dens and lateral masses are aligned. Straightening of the cervical lordosis. Skull base and vertebrae: No acute fracture. No primary bone lesion or focal pathologic process. Soft tissues and spinal canal: No prevertebral fluid or swelling. No visible canal hematoma. Disc levels: Disc height loss and endplate spurring most pronounced at C4-5 through C6-7 where there is at least mild canal stenosis at these levels. Minimal facet arthropathy. Upper chest: Visualized lung apices are clear. Other: None. IMPRESSION: 1. No acute intracranial findings. 2. No evidence of acute fracture or traumatic listhesis of the cervical spine. 3. 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 Electronically Signed   By: Duanne GuessNicholas  Plundo D.O.   On: 03/07/2020 17:57    Procedures .Ortho Injury Treatment  Date/Time: 03/07/2020 7:37  PM Performed by: Fayrene Helperran, Danh Bayus, PA-C Authorized by: Fayrene Helperran, Semira Stoltzfus, PA-C   Consent:    Consent obtained:  Written   Consent given by:  Patient   Risks discussed:  Irreducible dislocation, vascular damage, recurrent dislocation and nerve damage   Alternatives discussed:  Alternative treatmentInjury location: wrist Location details: right wrist Injury type: fracture-dislocation Pre-procedure neurovascular assessment: neurovascularly intact Pre-procedure distal perfusion: normal Pre-procedure neurological function: normal Pre-procedure range of motion: reduced  Anesthesia: Local anesthesia used: no  Patient sedated: Yes. Refer to sedation procedure documentation for details of sedation. Manipulation performed: yes Reduction successful: yes X-ray confirmed reduction: yes Immobilization: splint Splint type: sugar tong Supplies used: Ortho-Glass Post-procedure neurovascular assessment: post-procedure neurovascularly intact Post-procedure distal perfusion: normal Post-procedure neurological function: normal Post-procedure range of motion: improved Patient tolerance: patient tolerated the procedure well with no immediate complications  .Ortho Injury Treatment  Date/Time: 03/07/2020 7:38 PM Performed by: Fayrene Helperran, Kevis Qu, PA-C Authorized by: Fayrene Helperran, Park Beck, PA-C   Consent:    Consent obtained:  Written   Consent given by:  Patient   Risks discussed:  Irreducible dislocation, nerve damage and vascular damage   Alternatives discussed:  Alternative treatmentInjury location: wrist Location details: left wrist Injury type: fracture-dislocation Pre-procedure neurovascular assessment: neurovascularly intact Pre-procedure distal perfusion: normal Pre-procedure neurological function: normal Pre-procedure range of motion: reduced  Anesthesia: Local anesthesia used: no  Patient sedated: Yes. Refer to sedation procedure documentation for details of sedation. Manipulation performed: yes Reduction  successful: yes X-ray confirmed reduction: yes Immobilization: splint Splint type: sugar tong Supplies used: Ortho-Glass Post-procedure neurovascular assessment: post-procedure neurovascularly intact Post-procedure distal perfusion: normal Post-procedure neurological function: normal Post-procedure range of motion: improved Patient tolerance: patient tolerated the procedure well with no immediate complications    (including critical care time)  Medications Ordered in ED Medications  oxyCODONE-acetaminophen (PERCOCET/ROXICET) 5-325 MG per tablet 2 tablet (has no administration in time range)  chlordiazePOXIDE (LIBRIUM) capsule 50 mg (has no administration in time range)  sodium chloride 0.9 % bolus 1,000 mL (1,000 mLs Intravenous New Bag/Given 03/07/20 1657)  fentaNYL (SUBLIMAZE) injection 50 mcg (50 mcg Intravenous Given 03/07/20 1655)  ondansetron (ZOFRAN) injection 4  mg (4 mg Intravenous Given 03/07/20 1701)  Tdap (BOOSTRIX) injection 0.5 mL (0.5 mLs Intramuscular Given 03/07/20 1701)  ketamine 50 mg in normal saline 5 mL (10 mg/mL) syringe (76 mg Intravenous Given 03/07/20 1846)  propofol (DIPRIVAN) 10 mg/mL bolus/IV push 50 mg (50 mg Intravenous Given 03/07/20 1847)  HYDROmorphone (DILAUDID) injection 1 mg (1 mg Intravenous Given 03/07/20 1756)  ketamine (KETALAR) injection (25 mg Intravenous Given 03/07/20 1816)  propofol (DIPRIVAN) 500 MG/50ML infusion (50 mg Intravenous Canceled Entry 03/07/20 1815)  propofol (DIPRIVAN) 10 mg/mL bolus/IV push (50 mg Intravenous Given 03/07/20 1819)    ED Course  I have reviewed the triage vital signs and the nursing notes.  Pertinent labs & imaging results that were available during my care of the patient were reviewed by me and considered in my medical decision making (see chart for details).    MDM Rules/Calculators/A&P                          BP (!) 188/90   Pulse 74   Temp 98.3 F (36.8 C) (Oral)   Resp 20   Ht 5\' 11"  (1.803 m)    Wt 75.8 kg   SpO2 97%   BMI 23.29 kg/m   Final Clinical Impression(s) / ED Diagnoses Final diagnoses:  Closed Colles' fracture of right radius, initial encounter  Closed Colles' fracture of left radius, initial encounter  Forehead abrasion, initial encounter  Fall in home, initial encounter    Rx / DC Orders ED Discharge Orders         Ordered    oxyCODONE-acetaminophen (PERCOCET) 5-325 MG tablet  Every 6 hours PRN        03/07/20 1941    chlordiazePOXIDE (LIBRIUM) 25 MG capsule        03/07/20 2000         5:15 PM Patient was standing on a chair fixing his modem when he fell striking his head and injured both of his wrist from a FOOSH injury.  Pain most significant to bilateral wrist.  It appears that he has close deformities of both wrist x-ray did confirm evidence of distal radial fractures bilaterally with angulation.  He is neurovascular intact.  He does have abrasion to his forehead and having neck pain.  Head and cervical spine CT ordered.  Pain medication and antinausea medication given.  Patient will need to be undergo reduction of his wrists.  5:46 PM Patient report minimal improvement of symptoms after initial dose of 50 micrograms of fentanyl. Recheck blood pressure is stable for general pain medication. Will perform procedural sedation and will reduce bilateral wrists and will apply a sugar tong wrist splints.  7:39 PM Interval reduction of bilateral wrist after procedure sedation and manual reduction by me under the supervision of Dr. 03/09/20.  Patient prescribed a short course of opiate pain medication.  Appropriate instruction provided.  Patient will follow up closely with hand specialist next week for further care. Librium given for alcohol withdrawal.     Rhunette Croft, PA-C 03/07/20 2002    03/09/20, MD 03/07/20 03/09/20    1610, MD 03/30/20 1102

## 2020-03-10 ENCOUNTER — Encounter (HOSPITAL_BASED_OUTPATIENT_CLINIC_OR_DEPARTMENT_OTHER): Payer: Self-pay | Admitting: Orthopedic Surgery

## 2020-03-10 ENCOUNTER — Other Ambulatory Visit: Payer: Self-pay | Admitting: Orthopedic Surgery

## 2020-03-10 ENCOUNTER — Other Ambulatory Visit: Payer: Self-pay

## 2020-03-11 ENCOUNTER — Other Ambulatory Visit: Payer: Self-pay

## 2020-03-11 ENCOUNTER — Encounter (HOSPITAL_BASED_OUTPATIENT_CLINIC_OR_DEPARTMENT_OTHER): Payer: Self-pay | Admitting: Orthopedic Surgery

## 2020-03-13 ENCOUNTER — Other Ambulatory Visit (HOSPITAL_COMMUNITY)
Admission: RE | Admit: 2020-03-13 | Discharge: 2020-03-13 | Disposition: A | Discharge status: Home / Self Care | Payer: 59 | Source: Ambulatory Visit | Attending: Orthopedic Surgery | Admitting: Orthopedic Surgery

## 2020-03-13 DIAGNOSIS — Z20822 Contact with and (suspected) exposure to covid-19: Secondary | ICD-10-CM | POA: Insufficient documentation

## 2020-03-13 DIAGNOSIS — Z01812 Encounter for preprocedural laboratory examination: Secondary | ICD-10-CM | POA: Diagnosis present

## 2020-03-13 LAB — SARS CORONAVIRUS 2 (TAT 6-24 HRS): SARS Coronavirus 2: NEGATIVE

## 2020-03-15 NOTE — H&P (Signed)
[name] Brady is an 44 y.o. male.   Chief Complaint: B wrist injuries HPI: s/p FOOSH 11-21, sustaining B DRFx.  Was evaluated in emergency department where both distal radius fractures were provisionally reduced by the EDP and splinted.  Was subsequently evaluated at [facility] by Elodia Florence, PA on 11-22.  The left distal radius fracture is clearly displaced sufficiently to benefit from open treatment.  On the right side, it is marginal, and would plan to further evaluate that this week.  Should it require operative treatment, my preference is to address it at the end of the week in a separate setting for a variety of reasons.  I was out of town when he was in the office, but his situation was communicated with me and I reviewed his radiographs and helping to formulate a plan.  He presents today for operative treatment on the left side.  Past Medical History:  Diagnosis Date  . Anxiety   . Bipolar disorder (HCC)   . Family history of anesthesia complication    difficult for father to wake up"  . Hypertension     Past Surgical History:  Procedure Laterality Date  . KNEE ARTHROSCOPY Right   . ORIF ANKLE FRACTURE Right 06/02/2017   Procedure: OPEN REDUCTION INTERNAL FIXATION (ORIF) ANKLE FRACTURE;  Surgeon: Yolonda Kida, MD;  Location: [facility] OR;  Service: Orthopedics;  Laterality: Right;  90 mins  . WISDOM TOOTH EXTRACTION    . WRIST SURGERY Right     History reviewed. No pertinent family history. Social History:  reports that he has quit smoking. He smoked 0.00 packs per day for 18.00 years. He has never used smokeless tobacco. He reports current drug use. Drugs: Marijuana and Cocaine. He reports that he does not drink alcohol.  Allergies: No Known Allergies  No medications prior to admission.    No results found for this or any previous visit (from the past 48 hour(s)). No results found.  Review of Systems  All other systems reviewed and are negative.   Height 5\' 11"   (1.803 m), weight 75.8 kg. Physical Exam  Constitutional:  WD, WN, NAD HEENT:  NCAT, EOMI Neuro/Psych:  Alert & oriented to person, place, and time; appropriate mood & affect Lymphatic: No generalized UE edema or lymphadenopathy Extremities / MSK:  Both UE are normal with respect to appearance, ranges of motion, joint stability, muscle strength/tone, sensation, & perfusion except as otherwise noted:  On the right side, there is a sugar tong splint.  Intact light touch sensibility in the radial, median, and ulnar nerve distributions with intact motor to the same.  On the operative left side, there is a well fitting sugar tong splint that allows decent digital motion.  Active motion of the digits is only mildly restricted, nearly full at least to the extent allowable by the splint.  There is intact light touch sensibility across all the digital tips.  He can flex and extend the digits and abduct them.  Labs / Xrays:  4 views of the left wrist ordered and obtained reveal a comminuted distal radius fracture obscured by overlying splinting material.  There is dorsal translation and significant dorsal tilt what appears to be an extra-articular distal radius fracture  Assessment: Comminuted displaced left distal radius fracture  Plan:  I discussed these findings with him and recommended surgical reconstruction to obtain improved alignment of this comminuted fracture.  I favor a volar approach with locked volar plating.  The details of the operative procedure  were discussed with the patient.  Questions were invited and answered.  In addition to the goal of the procedure, the risks of the procedure to include but not limited to bleeding; infection; damage to the nerves or blood vessels that could result in bleeding, numbness, weakness, chronic pain, and the need for additional procedures; stiffness; the need for revision surgery; and anesthetic risks were reviewed.  No specific outcome was guaranteed or  implied.  Informed consent was obtained.   [name], MD 03/15/2020, 7:29 PM

## 2020-03-15 NOTE — H&P (View-Only) (Signed)
[name] Brady is an 44 y.o. male.   Chief Complaint: B wrist injuries HPI: s/p FOOSH 11-21, sustaining B DRFx.  Was evaluated in emergency department where both distal radius fractures were provisionally reduced by the EDP and splinted.  Was subsequently evaluated at [facility] by Elodia Florence, PA on 11-22.  The left distal radius fracture is clearly displaced sufficiently to benefit from open treatment.  On the right side, it is marginal, and would plan to further evaluate that this week.  Should it require operative treatment, my preference is to address it at the end of the week in a separate setting for a variety of reasons.  I was out of town when he was in the office, but his situation was communicated with me and I reviewed his radiographs and helping to formulate a plan.  He presents today for operative treatment on the left side.  Past Medical History:  Diagnosis Date  . Anxiety   . Bipolar disorder (HCC)   . Family history of anesthesia complication    difficult for father to wake up"  . Hypertension     Past Surgical History:  Procedure Laterality Date  . KNEE ARTHROSCOPY Right   . ORIF ANKLE FRACTURE Right 06/02/2017   Procedure: OPEN REDUCTION INTERNAL FIXATION (ORIF) ANKLE FRACTURE;  Surgeon: Yolonda Kida, MD;  Location: [facility];  Service: Orthopedics;  Laterality: Right;  90 mins  . WISDOM TOOTH EXTRACTION    . WRIST SURGERY Right     History reviewed. No pertinent family history. Social History:  reports that he has quit smoking. He smoked 0.00 packs per day for 18.00 years. He has never used smokeless tobacco. He reports current drug use. Drugs: Marijuana and Cocaine. He reports that he does not drink alcohol.  Allergies: No Known Allergies  No medications prior to admission.    No results found for this or any previous visit (from the past 48 hour(s)). No results found.  Review of Systems  All other systems reviewed and are negative.   Height 5\' 11"   (1.803 m), weight 75.8 kg. Physical Exam  Constitutional:  WD, WN, NAD HEENT:  NCAT, EOMI Neuro/Psych:  Alert & oriented to person, place, and time; appropriate mood & affect Lymphatic: No generalized UE edema or lymphadenopathy Extremities / MSK:  Both UE are normal with respect to appearance, ranges of motion, joint stability, muscle strength/tone, sensation, & perfusion except as otherwise noted:  On the right side, there is a sugar tong splint.  Intact light touch sensibility in the radial, median, and ulnar nerve distributions with intact motor to the same.  On the operative left side, there is a well fitting sugar tong splint that allows decent digital motion.  Active motion of the digits is only mildly restricted, nearly full at least to the extent allowable by the splint.  There is intact light touch sensibility across all the digital tips.  He can flex and extend the digits and abduct them.  Labs / Xrays:  4 views of the left wrist ordered and obtained reveal a comminuted distal radius fracture obscured by overlying splinting material.  There is dorsal translation and significant dorsal tilt what appears to be an extra-articular distal radius fracture  Assessment: Comminuted displaced left distal radius fracture  Plan:  I discussed these findings with him and recommended surgical reconstruction to obtain improved alignment of this comminuted fracture.  I favor a volar approach with locked volar plating.  The details of the operative procedure  were discussed with the patient.  Questions were invited and answered.  In addition to the goal of the procedure, the risks of the procedure to include but not limited to bleeding; infection; damage to the nerves or blood vessels that could result in bleeding, numbness, weakness, chronic pain, and the need for additional procedures; stiffness; the need for revision surgery; and anesthetic risks were reviewed.  No specific outcome was guaranteed or  implied.  Informed consent was obtained.   Rossie Scarfone A Wendel Homeyer, MD 03/15/2020, 7:29 PM   

## 2020-03-16 ENCOUNTER — Encounter (HOSPITAL_BASED_OUTPATIENT_CLINIC_OR_DEPARTMENT_OTHER)
Admission: RE | Disposition: A | Discharge status: Home / Self Care | Payer: Self-pay | Source: Home / Self Care | Attending: Orthopedic Surgery

## 2020-03-16 ENCOUNTER — Ambulatory Visit (HOSPITAL_COMMUNITY): Payer: 59

## 2020-03-16 ENCOUNTER — Ambulatory Visit (HOSPITAL_BASED_OUTPATIENT_CLINIC_OR_DEPARTMENT_OTHER): Payer: 59 | Admitting: Anesthesiology

## 2020-03-16 ENCOUNTER — Ambulatory Visit (HOSPITAL_BASED_OUTPATIENT_CLINIC_OR_DEPARTMENT_OTHER)
Admission: RE | Admit: 2020-03-16 | Discharge: 2020-03-16 | Disposition: A | Discharge status: Home / Self Care | Payer: 59 | Attending: Orthopedic Surgery | Admitting: Orthopedic Surgery

## 2020-03-16 ENCOUNTER — Encounter (HOSPITAL_BASED_OUTPATIENT_CLINIC_OR_DEPARTMENT_OTHER): Payer: Self-pay | Admitting: Orthopedic Surgery

## 2020-03-16 ENCOUNTER — Other Ambulatory Visit: Payer: Self-pay

## 2020-03-16 DIAGNOSIS — W19XXXA Unspecified fall, initial encounter: Secondary | ICD-10-CM | POA: Insufficient documentation

## 2020-03-16 DIAGNOSIS — S52572A Other intraarticular fracture of lower end of left radius, initial encounter for closed fracture: Secondary | ICD-10-CM | POA: Insufficient documentation

## 2020-03-16 DIAGNOSIS — Z4789 Encounter for other orthopedic aftercare: Secondary | ICD-10-CM

## 2020-03-16 DIAGNOSIS — Z87891 Personal history of nicotine dependence: Secondary | ICD-10-CM | POA: Diagnosis not present

## 2020-03-16 HISTORY — PX: OPEN REDUCTION INTERNAL FIXATION (ORIF) DISTAL RADIAL FRACTURE: SHX5989

## 2020-03-16 SURGERY — OPEN REDUCTION INTERNAL FIXATION (ORIF) DISTAL RADIUS FRACTURE
Anesthesia: Monitor Anesthesia Care | Site: Wrist | Laterality: Left

## 2020-03-16 MED ORDER — BUPIVACAINE-EPINEPHRINE (PF) 0.5% -1:200000 IJ SOLN
INTRAMUSCULAR | Status: DC | PRN
  Administered 2020-03-16: 30 mL via PERINEURAL

## 2020-03-16 MED ORDER — CLONIDINE HCL (ANALGESIA) 100 MCG/ML EP SOLN
EPIDURAL | Status: DC | PRN
  Administered 2020-03-16: 50 ug

## 2020-03-16 MED ORDER — PROPOFOL 500 MG/50ML IV EMUL
INTRAVENOUS | Status: DC | PRN
  Administered 2020-03-16: 125 ug/kg/min via INTRAVENOUS

## 2020-03-16 MED ORDER — MIDAZOLAM HCL 2 MG/2ML IJ SOLN
INTRAMUSCULAR | Status: AC
  Filled 2020-03-16: qty 2

## 2020-03-16 MED ORDER — LIDOCAINE 2% (20 MG/ML) 5 ML SYRINGE
INTRAMUSCULAR | Status: DC | PRN
  Administered 2020-03-16: 30 mg via INTRAVENOUS

## 2020-03-16 MED ORDER — FENTANYL CITRATE (PF) 100 MCG/2ML IJ SOLN
INTRAMUSCULAR | Status: AC
  Filled 2020-03-16: qty 2

## 2020-03-16 MED ORDER — ACETAMINOPHEN 325 MG PO TABS: 650.0000 mg | ORAL_TABLET | Freq: Four times a day (QID) | ORAL | Status: AC

## 2020-03-16 MED ORDER — CEFAZOLIN SODIUM-DEXTROSE 2-4 GM/100ML-% IV SOLN
2.0000 g | INTRAVENOUS | Status: AC
  Administered 2020-03-16: 2 g via INTRAVENOUS

## 2020-03-16 MED ORDER — PROPOFOL 10 MG/ML IV BOLUS
INTRAVENOUS | Status: DC | PRN
  Administered 2020-03-16: 200 mg via INTRAVENOUS

## 2020-03-16 MED ORDER — IBUPROFEN 200 MG PO TABS: 600.0000 mg | ORAL_TABLET | Freq: Four times a day (QID) | ORAL | Status: AC

## 2020-03-16 MED ORDER — OXYCODONE HCL 5 MG PO TABS
5.0000 mg | ORAL_TABLET | Freq: Four times a day (QID) | ORAL | 0 refills | Status: DC | PRN
Start: 2020-03-16 — End: 2020-03-20

## 2020-03-16 MED ORDER — EPHEDRINE SULFATE 50 MG/ML IJ SOLN
INTRAMUSCULAR | Status: DC | PRN
  Administered 2020-03-16: 10 mg via INTRAVENOUS

## 2020-03-16 MED ORDER — EPHEDRINE 5 MG/ML INJ
INTRAVENOUS | Status: AC
  Filled 2020-03-16: qty 10

## 2020-03-16 MED ORDER — ONDANSETRON HCL 4 MG/2ML IJ SOLN
INTRAMUSCULAR | Status: DC | PRN
  Administered 2020-03-16: 4 mg via INTRAVENOUS

## 2020-03-16 MED ORDER — DEXMEDETOMIDINE (PRECEDEX) IN NS 20 MCG/5ML (4 MCG/ML) IV SYRINGE
PREFILLED_SYRINGE | INTRAVENOUS | Status: DC | PRN
  Administered 2020-03-16: 16 ug via INTRAVENOUS

## 2020-03-16 MED ORDER — LACTATED RINGERS IV SOLN: INTRAVENOUS | Status: DC

## 2020-03-16 MED ORDER — MIDAZOLAM HCL 2 MG/2ML IJ SOLN
2.0000 mg | Freq: Once | INTRAMUSCULAR | Status: AC
  Administered 2020-03-16: 2 mg via INTRAVENOUS

## 2020-03-16 MED ORDER — CEFAZOLIN SODIUM-DEXTROSE 2-4 GM/100ML-% IV SOLN
INTRAVENOUS | Status: AC
  Filled 2020-03-16: qty 100

## 2020-03-16 MED ORDER — FENTANYL CITRATE (PF) 100 MCG/2ML IJ SOLN
100.0000 ug | Freq: Once | INTRAMUSCULAR | Status: AC
  Administered 2020-03-16: 100 ug via INTRAVENOUS

## 2020-03-16 SURGICAL SUPPLY — 72 items
APL PRP STRL LF DISP 70% ISPRP (MISCELLANEOUS) ×1
BAND INSRT 18 STRL LF DISP RB (MISCELLANEOUS)
BAND RUBBER #18 3X1/16 STRL (MISCELLANEOUS) IMPLANT
BIT DRILL SOLID 2.0X40MM (BIT) IMPLANT
BIT DRILL SOLID 2.5X40MM (BIT) IMPLANT
BLADE HEX COATED 2.75 (ELECTRODE) ×3 IMPLANT
BLADE MINI RND TIP GREEN BEAV (BLADE) IMPLANT
BLADE SURG 15 STRL LF DISP TIS (BLADE) ×1 IMPLANT
BLADE SURG 15 STRL SS (BLADE) ×3
BNDG CMPR 9X4 STRL LF SNTH (GAUZE/BANDAGES/DRESSINGS) ×1
BNDG COHESIVE 2X5 TAN STRL LF (GAUZE/BANDAGES/DRESSINGS) IMPLANT
BNDG COHESIVE 4X5 TAN STRL (GAUZE/BANDAGES/DRESSINGS) ×3 IMPLANT
BNDG ESMARK 4X9 LF (GAUZE/BANDAGES/DRESSINGS) ×3 IMPLANT
BNDG GAUZE ELAST 4 BULKY (GAUZE/BANDAGES/DRESSINGS) ×3 IMPLANT
BRUSH SCRUB EZ PLAIN DRY (MISCELLANEOUS) IMPLANT
CANISTER SUCT 1200ML W/VALVE (MISCELLANEOUS) ×3 IMPLANT
CHLORAPREP W/TINT 26 (MISCELLANEOUS) ×3 IMPLANT
CORD BIPOLAR FORCEPS 12FT (ELECTRODE) ×3 IMPLANT
COVER BACK TABLE 60X90IN (DRAPES) ×3 IMPLANT
COVER MAYO STAND STRL (DRAPES) ×3 IMPLANT
COVER WAND RF STERILE (DRAPES) IMPLANT
CUFF TOURN SGL QUICK 18X4 (TOURNIQUET CUFF) ×2 IMPLANT
CUFF TOURN SGL QUICK 24 (TOURNIQUET CUFF)
CUFF TRNQT CYL 24X4X16.5-23 (TOURNIQUET CUFF) IMPLANT
DRAPE C-ARM 42X72 X-RAY (DRAPES) ×3 IMPLANT
DRAPE EXTREMITY T 121X128X90 (DISPOSABLE) ×3 IMPLANT
DRAPE SURG 17X23 STRL (DRAPES) ×3 IMPLANT
DRILL SOLID 2.0X40MM (BIT)
DRILL SOLID 2.5X40MM (BIT)
DRSG ADAPTIC 3X8 NADH LF (GAUZE/BANDAGES/DRESSINGS) ×3 IMPLANT
DRSG EMULSION OIL 3X3 NADH (GAUZE/BANDAGES/DRESSINGS) IMPLANT
ELECT REM PT RETURN 9FT ADLT (ELECTROSURGICAL) ×3
ELECTRODE REM PT RTRN 9FT ADLT (ELECTROSURGICAL) ×1 IMPLANT
GAUZE SPONGE 4X4 12PLY STRL LF (GAUZE/BANDAGES/DRESSINGS) ×3 IMPLANT
GLOVE BIO SURGEON STRL SZ7.5 (GLOVE) ×3 IMPLANT
GLOVE BIOGEL PI IND STRL 7.0 (GLOVE) ×1 IMPLANT
GLOVE BIOGEL PI IND STRL 8 (GLOVE) ×1 IMPLANT
GLOVE BIOGEL PI INDICATOR 7.0 (GLOVE) ×2
GLOVE BIOGEL PI INDICATOR 8 (GLOVE) ×2
GLOVE ECLIPSE 6.5 STRL STRAW (GLOVE) ×3 IMPLANT
GOWN STRL REUS W/ TWL LRG LVL3 (GOWN DISPOSABLE) ×2 IMPLANT
GOWN STRL REUS W/TWL LRG LVL3 (GOWN DISPOSABLE) ×6
GOWN STRL REUS W/TWL XL LVL3 (GOWN DISPOSABLE) ×3 IMPLANT
NDL HYPO 25X1 1.5 SAFETY (NEEDLE) IMPLANT
NEEDLE HYPO 25X1 1.5 SAFETY (NEEDLE) IMPLANT
NS IRRIG 1000ML POUR BTL (IV SOLUTION) ×3 IMPLANT
PACK BASIN DAY SURGERY FS (CUSTOM PROCEDURE TRAY) ×3 IMPLANT
PADDING CAST ABS 4INX4YD NS (CAST SUPPLIES)
PADDING CAST ABS COTTON 4X4 ST (CAST SUPPLIES) IMPLANT
PENCIL SMOKE EVACUATOR (MISCELLANEOUS) ×3 IMPLANT
SKELETAL DYNAMICS DVR SET (Set) ×2 IMPLANT
SLEEVE SCD COMPRESS KNEE MED (MISCELLANEOUS) ×3 IMPLANT
SLING ARM FOAM STRAP LRG (SOFTGOODS) ×2 IMPLANT
SPLINT PLASTER CAST XFAST 3X15 (CAST SUPPLIES) IMPLANT
SPLINT PLASTER XTRA FASTSET 3X (CAST SUPPLIES)
STOCKINETTE 6  STRL (DRAPES) ×3
STOCKINETTE 6 STRL (DRAPES) ×1 IMPLANT
SUCTION FRAZIER HANDLE 10FR (MISCELLANEOUS) ×3
SUCTION TUBE FRAZIER 10FR DISP (MISCELLANEOUS) ×1 IMPLANT
SUT VIC AB 2-0 PS2 27 (SUTURE) ×3 IMPLANT
SUT VICRYL 4-0 PS2 18IN ABS (SUTURE) IMPLANT
SUT VICRYL RAPIDE 4-0 (SUTURE) IMPLANT
SUT VICRYL RAPIDE 4/0 PS 2 (SUTURE) ×3 IMPLANT
SYR 10ML LL (SYRINGE) IMPLANT
SYR BULB EAR ULCER 3OZ GRN STR (SYRINGE) ×3 IMPLANT
TOWEL GREEN STERILE FF (TOWEL DISPOSABLE) ×3 IMPLANT
TUBE CONNECTING 20'X1/4 (TUBING) ×1
TUBE CONNECTING 20X1/4 (TUBING) ×2 IMPLANT
UNDERPAD 30X36 HEAVY ABSORB (UNDERPADS AND DIAPERS) ×3 IMPLANT
WIRE FIX 1.5 STANDARD TIP (WIRE)
WIRE FIX 1.5 STD TIP (WIRE) IMPLANT
driver square tip ×4 IMPLANT

## 2020-03-16 NOTE — Anesthesia Postprocedure Evaluation (Deleted)
Anesthesia Post Note  Patient: [name] Brady  Procedure(s) Performed: OPEN REDUCTION INTERNAL FIXATION (ORIF) LEFT DISTAL RADIAL FRACTURE (Left Wrist)     Patient location during evaluation: PACU Anesthesia Type: Regional Level of consciousness: awake and alert Pain management: pain level controlled Vital Signs Assessment: post-procedure vital signs reviewed and stable Respiratory status: spontaneous breathing, nonlabored ventilation and respiratory function stable Cardiovascular status: blood pressure returned to baseline and stable Postop Assessment: no apparent nausea or vomiting Anesthetic complications: no   No complications documented.  Last Vitals:  Vitals:   03/16/20 1400 03/16/20 1422  BP: (!) 133/100 (!) 156/93  Pulse: 61 62  Resp: 19 16  Temp:  36.5 C  SpO2: 95% 100%    Last Pain:  Vitals:   03/16/20 1422  TempSrc:   PainSc: 0-No pain                 Lowella Curb

## 2020-03-16 NOTE — Discharge Instructions (Signed)
Discharge Instructions   You have a dressing with a plaster splint incorporated in it. Move your fingers as much as possible, making a full fist and fully opening the fist. Elevate your hand to reduce pain & swelling of the digits.  Ice over the operative site may be helpful to reduce pain & swelling.  DO NOT USE HEAT. Pain medicine has been prescribed for you.  Take Tylenol 650 mg and Ibuprofen 600 mg OTC every 6 hours together. Take the Oxycodone 5 mg additionally for severe post operative pain as a rescue medicine.  Leave the dressing in place until you return to our office.  You may shower, but keep the bandage clean & dry.  You may drive a car when you are off of prescription pain medications and can safely control your vehicle with both hands. Our office will call you to arrange follow-up. YOU HAVE AN APPOINTMENT ON 03/18/20 at 9:45 am at [facility] for the left wrist.   Please call [phone] during normal business hours or [phone] after hours for any problems. Including the following:  - excessive redness of the incisions - drainage for more than 4 days - fever of more than 101.5 F  *Please note that pain medications will not be refilled after hours or on weekends.  Work Status: No lifting, gripping or grasping greater than pencil and paper tasks with either hand until first post operative appointment.    Post Anesthesia Home Care Instructions  Activity: Get plenty of rest for the remainder of the day. A responsible individual must stay with you for 24 hours following the procedure.  For the next 24 hours, DO NOT: -Drive a car -Advertising copywriter -Drink alcoholic beverages -Take any medication unless instructed by your physician -Make any legal decisions or sign important papers.  Meals: Start with liquid foods such as gelatin or soup. Progress to regular foods as tolerated. Avoid greasy, spicy, heavy foods. If nausea and/or vomiting occur, drink only clear  liquids until the nausea and/or vomiting subsides. Call your physician if vomiting continues.  Special Instructions/Symptoms: Your throat may feel dry or sore from the anesthesia or the breathing tube placed in your throat during surgery. If this causes discomfort, gargle with warm salt water. The discomfort should disappear within 24 hours.  If you had a scopolamine patch placed behind your ear for the management of post- operative nausea and/or vomiting:  1. The medication in the patch is effective for 72 hours, after which it should be removed.  Wrap patch in a tissue and discard in the trash. Wash hands thoroughly with soap and water. 2. You may remove the patch earlier than 72 hours if you experience unpleasant side effects which may include dry mouth, dizziness or visual disturbances. 3. Avoid touching the patch. Wash your hands with soap and water after contact with the patch.    Regional Anesthesia Blocks  1. Numbness or the inability to move the "blocked" extremity may last from 3-48 hours after placement. The length of time depends on the medication injected and your individual response to the medication. If the numbness is not going away after 48 hours, call your surgeon.  2. The extremity that is blocked will need to be protected until the numbness is gone and the  Strength has returned. Because you cannot feel it, you will need to take extra care to avoid injury. Because it may be weak, you may have difficulty moving it or using it. You may not know what  position it is in without looking at it while the block is in effect.  3. For blocks in the legs and feet, returning to weight bearing and walking needs to be done carefully. You will need to wait until the numbness is entirely gone and the strength has returned. You should be able to move your leg and foot normally before you try and bear weight or walk. You will need someone to be with you when you first try to ensure you do not fall  and possibly risk injury.  4. Bruising and tenderness at the needle site are common side effects and will resolve in a few days.  5. Persistent numbness or new problems with movement should be communicated to the surgeon or the [facility] [phone] [facility] [phone]).

## 2020-03-16 NOTE — Transfer of Care (Signed)
Immediate Anesthesia Transfer of Care Note  Patient: [name] Brady  Procedure(s) Performed: OPEN REDUCTION INTERNAL FIXATION (ORIF) LEFT DISTAL RADIAL FRACTURE (Left Wrist)  Patient Location: PACU  Anesthesia Type:GA combined with regional for post-op pain  Level of Consciousness: awake, alert , oriented and patient cooperative  Airway & Oxygen Therapy: Patient Spontanous Breathing and Patient connected to face mask oxygen  Post-op Assessment: Report given to RN and Post -op Vital signs reviewed and stable  Post vital signs: Reviewed and stable  Last Vitals:  Vitals Value Taken Time  BP 137/88 03/16/20 1334  Temp    Pulse 65 03/16/20 1336  Resp 16 03/16/20 1336  SpO2 98 % 03/16/20 1336  Vitals shown include unvalidated device data.  Last Pain:  Vitals:   03/16/20 1057  TempSrc: Oral  PainSc: 4       Patients Stated Pain Goal: 3 (03/16/20 1057)  Complications: No complications documented.

## 2020-03-16 NOTE — Interval H&P Note (Signed)
History and Physical Interval Note:  03/16/2020 10:52 AM  Toniann Ket  has presented today for surgery, with the diagnosis of LEFT DISTAL RADIUS FRACTURE.  The various methods of treatment have been discussed with the patient and family. After consideration of risks, benefits and other options for treatment, the patient has consented to  Procedure(s) with comments: OPEN REDUCTION INTERNAL FIXATION (ORIF) LEFT DISTAL RADIAL FRACTURE (Left) - LENGTH OF SURGERY: 75 MIN  PRE OP BLOCK as a surgical intervention.  The patient's history has been reviewed, patient examined, no change in status, stable for surgery.  I have reviewed the patient's chart and labs.  Questions were answered to the patient's satisfaction.     [name]

## 2020-03-16 NOTE — Anesthesia Postprocedure Evaluation (Signed)
Anesthesia Post Note  Patient: [name] Brady  Procedure(s) Performed: OPEN REDUCTION INTERNAL FIXATION (ORIF) LEFT DISTAL RADIAL FRACTURE (Left Wrist)     Patient location during evaluation: PACU Anesthesia Type: General Level of consciousness: awake and alert Pain management: pain level controlled Vital Signs Assessment: post-procedure vital signs reviewed and stable Respiratory status: spontaneous breathing, nonlabored ventilation and respiratory function stable Cardiovascular status: blood pressure returned to baseline and stable Postop Assessment: no apparent nausea or vomiting Anesthetic complications: no   No complications documented.  Last Vitals:  Vitals:   03/16/20 1400 03/16/20 1422  BP: (!) 133/100 (!) 156/93  Pulse: 61 62  Resp: 19 16  Temp:  36.5 C  SpO2: 95% 100%    Last Pain:  Vitals:   03/16/20 1422  TempSrc:   PainSc: 0-No pain                 Lowella Curb

## 2020-03-16 NOTE — Anesthesia Procedure Notes (Signed)
Procedure Name: LMA Insertion Date/Time: 03/16/2020 12:38 PM Performed by: Sheryn Bison, CRNA Pre-anesthesia Checklist: Patient identified, Emergency Drugs available, Suction available and Patient being monitored Patient Re-evaluated:Patient Re-evaluated prior to induction Oxygen Delivery Method: Circle System Utilized Preoxygenation: Pre-oxygenation with 100% oxygen Induction Type: IV induction Ventilation: Mask ventilation without difficulty LMA: LMA inserted LMA Size: 4.0 Number of attempts: 1 Airway Equipment and Method: bite block Placement Confirmation: positive ETCO2 Tube secured with: Tape Dental Injury: Teeth and Oropharynx as per pre-operative assessment

## 2020-03-16 NOTE — Op Note (Signed)
03/16/2020  10:52 AM  PATIENT:  [name] Brady  44 y.o. male  PRE-OPERATIVE DIAGNOSIS: Displaced left distal radius fracture  POST-OPERATIVE DIAGNOSIS:  Same  PROCEDURE: ORIF left intra-articular distal radius fracture (2-articular frags)  SURGEON: Rayvon Char. Grandville Silos, MD  PHYSICIAN ASSISTANT: Morley Kos, OPA-C  ANESTHESIA:  regional and MAC  SPECIMENS:  None  DRAINS: None  EBL:  less than 50 mL  PREOPERATIVE INDICATIONS:  Owyn Raulston is a  44 y.o. male with displaced left extra-articular distal radius fracture resulting from a fall  The risks benefits and alternatives were discussed with the patient preoperatively including but not limited to the risks of infection, bleeding, nerve injury, cardiopulmonary complications, the need for revision surgery, among others, and the patient verbalized understanding and consented to proceed.  OPERATIVE IMPLANTS: Skeletal dynamics Geminus plate/screws/pegs  OPERATIVE PROCEDURE: After receiving prophylactic antibiotics and a regional block, the patient was escorted to the operative theatre and placed in a supine position.   A surgical "time-out" was performed during which the planned procedure, proposed operative site, and the correct patient identity were compared to the operative consent and agreement confirmed by the circulating nurse according to current facility policy. Following application of a tourniquet to the operative extremity, the exposed skin was pre-scrubbed with Hibiclens scrub brush and then was prepped with Chloraprep and draped in the usual sterile fashion. The limb was exsanguinated with an Esmarch bandage and the tourniquet inflated to approximately 160mHg higher than systolic BP.   A sinusoidal-shaped incision was marked and made over the FCR axis and the distal forearm. The skin was incised sharply with scalpel, subcutaneous tissues with blunt and spreading dissection. The FCR axis was exploited deeply. The pronator  quadratus was reflected in an L-shaped ulnarly. The fracture was inspected and provisionally reduced.  This was confirmed fluoroscopically. The appropriately sized plate was selected and found to fit well. It was placed in its provisional alignment of the radius and this was confirmed fluoroscopically.  It was secured to the radius with a screw through the slotted hole. It was then that the sagittal split was observed, requiring fixation of the ulnar column to the plate first, followed by fixation of the radial column.  Additional adjustments were made as necessary, and the distal holes were all drilled and filled.  Peg/screw length distally was selected on the shorter side of measurements to minimize the risk for dorsal cortical penetration. The remainder of the proximal holes were drilled and filled.   Final images were obtained and the DRUJ was examined for stability. It was found to be sufficiently stable. The wound was then copiously irrigated and the PQ was repaired with 2-0 Vicryl suture. Tourniquet was released and additional hemostasis obtained and the skin was closed with 2-0 Vicryl deep dermal buried sutures followed by running 4-0 Vicryl Rapide horizontal mattress/subcuticular suture in the skin, covered by steri-strips. A bulky dressing with a volar plaster component was applied and the patient was taken to the recovery room in stable condition.  DISPOSITION: The patient will be discharged home today with typical post-op instructions, returning in 10-15 days for reevaluation with new x-rays of the affected wrist out of the splint to include an inclined lateral and then transition to therapy to have a custom splint constructed and begin rehabilitation.

## 2020-03-16 NOTE — Anesthesia Preprocedure Evaluation (Signed)
Anesthesia Evaluation  Patient identified by MRN, date of birth, ID band Patient awake    Reviewed: Allergy & Precautions, NPO status , Patient's Chart, lab work & pertinent test results  Airway Mallampati: II  TM Distance: >3 FB     Dental  (+) Dental Advisory Given   Pulmonary former smoker,    breath sounds clear to auscultation       Cardiovascular hypertension, Pt. on home beta blockers  Rhythm:Regular Rate:Normal     Neuro/Psych negative neurological ROS     GI/Hepatic negative GI ROS, Neg liver ROS,   Endo/Other  negative endocrine ROS  Renal/GU negative Renal ROS     Musculoskeletal   Abdominal   Peds  Hematology  (+) anemia ,   Anesthesia Other Findings   Reproductive/Obstetrics                             Anesthesia Physical Anesthesia Plan  ASA: II  Anesthesia Plan: Regional and MAC   Post-op Pain Management:    Induction:   PONV Risk Score and Plan: 1 and Propofol infusion, Ondansetron and Treatment may vary due to age or medical condition  Airway Management Planned: Natural Airway and Simple Face Mask  Additional Equipment:   Intra-op Plan:   Post-operative Plan: Extubation in OR  Informed Consent: I have reviewed the patients History and Physical, chart, labs and discussed the procedure including the risks, benefits and alternatives for the proposed anesthesia with the patient or authorized representative who has indicated his/her understanding and acceptance.     Dental advisory given  Plan Discussed with: CRNA  Anesthesia Plan Comments:         Anesthesia Quick Evaluation

## 2020-03-16 NOTE — Progress Notes (Signed)
Assisted Dr. Rob Fitzgerald with left, ultrasound guided, axillary block. Side rails up, monitors on throughout procedure. See vital signs in flow sheet. Tolerated Procedure well. 

## 2020-03-16 NOTE — Anesthesia Procedure Notes (Signed)
Anesthesia Regional Block: Axillary brachial plexus block   Pre-Anesthetic Checklist: ,, timeout performed, Correct Patient, Correct Site, Correct Laterality, Correct Procedure, Correct Position, site marked, Risks and benefits discussed,  Surgical consent,  Pre-op evaluation,  At surgeon's request and post-op pain management  Laterality: Left  Prep: chloraprep       Needles:  Injection technique: Single-shot  Needle Type: Echogenic Stimulator Needle     Needle Length: 9cm  Needle Gauge: 21     Additional Needles:   Procedures:, nerve stimulator,,, ultrasound used (permanent image in chart),,,,   Nerve Stimulator or Paresthesia:  Response: MC, median, radial, ulnar responses elicited, 0.5 mA,   Additional Responses:   Narrative:  Start time: 03/16/2020 11:54 AM End time: 03/16/2020 12:01 PM Injection made incrementally with aspirations every 5 mL.  Performed by: Personally  Anesthesiologist: Marcene Duos, MD

## 2020-03-17 ENCOUNTER — Encounter (HOSPITAL_BASED_OUTPATIENT_CLINIC_OR_DEPARTMENT_OTHER): Payer: Self-pay | Admitting: Orthopedic Surgery

## 2020-03-18 ENCOUNTER — Other Ambulatory Visit (HOSPITAL_COMMUNITY)
Admission: RE | Admit: 2020-03-18 | Discharge: 2020-03-18 | Disposition: A | Discharge status: Home / Self Care | Payer: 59 | Source: Ambulatory Visit | Attending: Orthopedic Surgery | Admitting: Orthopedic Surgery

## 2020-03-18 DIAGNOSIS — Z01812 Encounter for preprocedural laboratory examination: Secondary | ICD-10-CM | POA: Diagnosis present

## 2020-03-18 DIAGNOSIS — Z20822 Contact with and (suspected) exposure to covid-19: Secondary | ICD-10-CM | POA: Insufficient documentation

## 2020-03-18 LAB — SARS CORONAVIRUS 2 (TAT 6-24 HRS): SARS Coronavirus 2: NEGATIVE

## 2020-03-20 ENCOUNTER — Other Ambulatory Visit: Payer: Self-pay

## 2020-03-20 ENCOUNTER — Ambulatory Visit (HOSPITAL_BASED_OUTPATIENT_CLINIC_OR_DEPARTMENT_OTHER): Payer: 59 | Admitting: Anesthesiology

## 2020-03-20 ENCOUNTER — Ambulatory Visit (HOSPITAL_COMMUNITY): Payer: 59

## 2020-03-20 ENCOUNTER — Encounter (HOSPITAL_BASED_OUTPATIENT_CLINIC_OR_DEPARTMENT_OTHER): Payer: Self-pay | Admitting: Orthopedic Surgery

## 2020-03-20 ENCOUNTER — Encounter (HOSPITAL_BASED_OUTPATIENT_CLINIC_OR_DEPARTMENT_OTHER)
Admission: RE | Disposition: A | Discharge status: Home / Self Care | Payer: Self-pay | Source: Home / Self Care | Attending: Orthopedic Surgery

## 2020-03-20 ENCOUNTER — Ambulatory Visit (HOSPITAL_BASED_OUTPATIENT_CLINIC_OR_DEPARTMENT_OTHER)
Admission: RE | Admit: 2020-03-20 | Discharge: 2020-03-20 | Disposition: A | Discharge status: Home / Self Care | Payer: 59 | Attending: Orthopedic Surgery | Admitting: Orthopedic Surgery

## 2020-03-20 DIAGNOSIS — D649 Anemia, unspecified: Secondary | ICD-10-CM | POA: Insufficient documentation

## 2020-03-20 DIAGNOSIS — Y939 Activity, unspecified: Secondary | ICD-10-CM | POA: Diagnosis not present

## 2020-03-20 DIAGNOSIS — F319 Bipolar disorder, unspecified: Secondary | ICD-10-CM | POA: Insufficient documentation

## 2020-03-20 DIAGNOSIS — Z87891 Personal history of nicotine dependence: Secondary | ICD-10-CM | POA: Diagnosis not present

## 2020-03-20 DIAGNOSIS — X58XXXA Exposure to other specified factors, initial encounter: Secondary | ICD-10-CM | POA: Insufficient documentation

## 2020-03-20 DIAGNOSIS — I10 Essential (primary) hypertension: Secondary | ICD-10-CM | POA: Diagnosis not present

## 2020-03-20 DIAGNOSIS — S52551A Other extraarticular fracture of lower end of right radius, initial encounter for closed fracture: Secondary | ICD-10-CM | POA: Diagnosis present

## 2020-03-20 DIAGNOSIS — T148XXA Other injury of unspecified body region, initial encounter: Secondary | ICD-10-CM

## 2020-03-20 HISTORY — PX: OPEN REDUCTION INTERNAL FIXATION (ORIF) DISTAL RADIAL FRACTURE: SHX5989

## 2020-03-20 SURGERY — OPEN REDUCTION INTERNAL FIXATION (ORIF) DISTAL RADIUS FRACTURE
Anesthesia: Regional | Site: Wrist | Laterality: Right

## 2020-03-20 MED ORDER — EPHEDRINE 5 MG/ML INJ
INTRAVENOUS | Status: AC
  Filled 2020-03-20: qty 10

## 2020-03-20 MED ORDER — PHENYLEPHRINE 40 MCG/ML (10ML) SYRINGE FOR IV PUSH (FOR BLOOD PRESSURE SUPPORT)
PREFILLED_SYRINGE | INTRAVENOUS | Status: AC
  Filled 2020-03-20: qty 10

## 2020-03-20 MED ORDER — ONDANSETRON HCL 4 MG/2ML IJ SOLN
INTRAMUSCULAR | Status: DC | PRN
  Administered 2020-03-20: 4 mg via INTRAVENOUS

## 2020-03-20 MED ORDER — OXYCODONE HCL 5 MG PO TABS: 5.0000 mg | ORAL_TABLET | Freq: Once | ORAL | Status: DC | PRN

## 2020-03-20 MED ORDER — MIDAZOLAM HCL 2 MG/2ML IJ SOLN
2.0000 mg | Freq: Once | INTRAMUSCULAR | Status: AC
  Administered 2020-03-20: 2 mg via INTRAVENOUS

## 2020-03-20 MED ORDER — LIDOCAINE 2% (20 MG/ML) 5 ML SYRINGE
INTRAMUSCULAR | Status: AC
  Filled 2020-03-20: qty 5

## 2020-03-20 MED ORDER — OXYCODONE HCL 5 MG PO TABS
5.0000 mg | ORAL_TABLET | Freq: Four times a day (QID) | ORAL | 0 refills | Status: AC | PRN
Start: 2020-03-20 — End: ?

## 2020-03-20 MED ORDER — DEXAMETHASONE SODIUM PHOSPHATE 10 MG/ML IJ SOLN
INTRAMUSCULAR | Status: AC
  Filled 2020-03-20: qty 1

## 2020-03-20 MED ORDER — FENTANYL CITRATE (PF) 100 MCG/2ML IJ SOLN
100.0000 ug | Freq: Once | INTRAMUSCULAR | Status: AC
  Administered 2020-03-20: 100 ug via INTRAVENOUS

## 2020-03-20 MED ORDER — CEFAZOLIN SODIUM-DEXTROSE 2-4 GM/100ML-% IV SOLN
INTRAVENOUS | Status: AC
  Filled 2020-03-20: qty 100

## 2020-03-20 MED ORDER — MIDAZOLAM HCL 2 MG/2ML IJ SOLN
INTRAMUSCULAR | Status: AC
  Filled 2020-03-20: qty 2

## 2020-03-20 MED ORDER — SUCCINYLCHOLINE CHLORIDE 200 MG/10ML IV SOSY
PREFILLED_SYRINGE | INTRAVENOUS | Status: AC
  Filled 2020-03-20: qty 10

## 2020-03-20 MED ORDER — FENTANYL CITRATE (PF) 100 MCG/2ML IJ SOLN
INTRAMUSCULAR | Status: AC
  Filled 2020-03-20: qty 2

## 2020-03-20 MED ORDER — PROPOFOL 500 MG/50ML IV EMUL
INTRAVENOUS | Status: DC | PRN
  Administered 2020-03-20: 25 ug/kg/min via INTRAVENOUS

## 2020-03-20 MED ORDER — LACTATED RINGERS IV SOLN: INTRAVENOUS | Status: DC

## 2020-03-20 MED ORDER — PROPOFOL 10 MG/ML IV BOLUS
INTRAVENOUS | Status: DC | PRN
  Administered 2020-03-20: 250 mg via INTRAVENOUS

## 2020-03-20 MED ORDER — PROPOFOL 10 MG/ML IV BOLUS
INTRAVENOUS | Status: AC
  Filled 2020-03-20: qty 20

## 2020-03-20 MED ORDER — FENTANYL CITRATE (PF) 100 MCG/2ML IJ SOLN
INTRAMUSCULAR | Status: DC | PRN
  Administered 2020-03-20: 50 ug via INTRAVENOUS

## 2020-03-20 MED ORDER — FENTANYL CITRATE (PF) 100 MCG/2ML IJ SOLN: 25.0000 ug | INTRAMUSCULAR | Status: DC | PRN

## 2020-03-20 MED ORDER — CEFAZOLIN SODIUM-DEXTROSE 2-4 GM/100ML-% IV SOLN
2.0000 g | INTRAVENOUS | Status: AC
  Administered 2020-03-20: 2 g via INTRAVENOUS

## 2020-03-20 MED ORDER — AMISULPRIDE (ANTIEMETIC) 5 MG/2ML IV SOLN: 10.0000 mg | Freq: Once | INTRAVENOUS | Status: DC | PRN

## 2020-03-20 MED ORDER — LIDOCAINE HCL (CARDIAC) PF 100 MG/5ML IV SOSY
PREFILLED_SYRINGE | INTRAVENOUS | Status: DC | PRN
  Administered 2020-03-20: 50 mg via INTRAVENOUS

## 2020-03-20 MED ORDER — PROMETHAZINE HCL 25 MG/ML IJ SOLN: 6.2500 mg | INTRAMUSCULAR | Status: DC | PRN

## 2020-03-20 MED ORDER — OXYCODONE HCL 5 MG/5ML PO SOLN: 5.0000 mg | Freq: Once | ORAL | Status: DC | PRN

## 2020-03-20 MED ORDER — ONDANSETRON HCL 4 MG/2ML IJ SOLN
INTRAMUSCULAR | Status: AC
  Filled 2020-03-20: qty 2

## 2020-03-20 SURGICAL SUPPLY — 78 items
APL PRP STRL LF DISP 70% ISPRP (MISCELLANEOUS) ×1
APL SKNCLS STERI-STRIP NONHPOA (GAUZE/BANDAGES/DRESSINGS) ×1
BAND INSRT 18 STRL LF DISP RB (MISCELLANEOUS)
BAND RUBBER #18 3X1/16 STRL (MISCELLANEOUS) IMPLANT
BENZOIN TINCTURE PRP APPL 2/3 (GAUZE/BANDAGES/DRESSINGS) ×2 IMPLANT
BIT DRILL SOLID 2.0X40MM (BIT) IMPLANT
BIT DRILL SOLID 2.5X40MM (BIT) IMPLANT
BLADE HEX COATED 2.75 (ELECTRODE) ×1 IMPLANT
BLADE MINI RND TIP GREEN BEAV (BLADE) IMPLANT
BLADE SURG 15 STRL LF DISP TIS (BLADE) ×1 IMPLANT
BLADE SURG 15 STRL SS (BLADE) ×3
BNDG CMPR 9X4 STRL LF SNTH (GAUZE/BANDAGES/DRESSINGS) ×1
BNDG COHESIVE 2X5 TAN STRL LF (GAUZE/BANDAGES/DRESSINGS) IMPLANT
BNDG COHESIVE 4X5 TAN STRL (GAUZE/BANDAGES/DRESSINGS) ×3 IMPLANT
BNDG ESMARK 4X9 LF (GAUZE/BANDAGES/DRESSINGS) ×3 IMPLANT
BNDG GAUZE ELAST 4 BULKY (GAUZE/BANDAGES/DRESSINGS) ×3 IMPLANT
BRUSH SCRUB EZ PLAIN DRY (MISCELLANEOUS) ×2 IMPLANT
CANISTER SUCT 1200ML W/VALVE (MISCELLANEOUS) ×3 IMPLANT
CHLORAPREP W/TINT 26 (MISCELLANEOUS) ×3 IMPLANT
CLOSURE WOUND 1/2 X4 (GAUZE/BANDAGES/DRESSINGS) ×1
CORD BIPOLAR FORCEPS 12FT (ELECTRODE) ×3 IMPLANT
COVER BACK TABLE 60X90IN (DRAPES) ×3 IMPLANT
COVER MAYO STAND STRL (DRAPES) ×3 IMPLANT
COVER WAND RF STERILE (DRAPES) IMPLANT
CUFF TOURN SGL QUICK 18X4 (TOURNIQUET CUFF) ×2 IMPLANT
CUFF TOURN SGL QUICK 24 (TOURNIQUET CUFF)
CUFF TRNQT CYL 24X4X16.5-23 (TOURNIQUET CUFF) IMPLANT
DRAPE C-ARM 42X72 X-RAY (DRAPES) ×3 IMPLANT
DRAPE EXTREMITY T 121X128X90 (DISPOSABLE) ×3 IMPLANT
DRAPE SURG 17X23 STRL (DRAPES) ×3 IMPLANT
DRILL SOLID 2.0X40MM (BIT)
DRILL SOLID 2.5X40MM (BIT)
DRSG ADAPTIC 3X8 NADH LF (GAUZE/BANDAGES/DRESSINGS) ×3 IMPLANT
DRSG EMULSION OIL 3X3 NADH (GAUZE/BANDAGES/DRESSINGS) IMPLANT
ELECT REM PT RETURN 9FT ADLT (ELECTROSURGICAL) ×3
ELECTRODE REM PT RTRN 9FT ADLT (ELECTROSURGICAL) ×1 IMPLANT
GAUZE SPONGE 4X4 12PLY STRL LF (GAUZE/BANDAGES/DRESSINGS) ×3 IMPLANT
GLOVE BIO SURGEON STRL SZ7 (GLOVE) ×2 IMPLANT
GLOVE BIO SURGEON STRL SZ7.5 (GLOVE) ×3 IMPLANT
GLOVE BIOGEL PI IND STRL 7.0 (GLOVE) ×1 IMPLANT
GLOVE BIOGEL PI IND STRL 8 (GLOVE) ×1 IMPLANT
GLOVE BIOGEL PI INDICATOR 7.0 (GLOVE) ×2
GLOVE BIOGEL PI INDICATOR 8 (GLOVE) ×2
GLOVE ECLIPSE 6.5 STRL STRAW (GLOVE) ×3 IMPLANT
GOWN STRL REUS W/ TWL LRG LVL3 (GOWN DISPOSABLE) ×2 IMPLANT
GOWN STRL REUS W/ TWL XL LVL3 (GOWN DISPOSABLE) IMPLANT
GOWN STRL REUS W/TWL LRG LVL3 (GOWN DISPOSABLE) ×6
GOWN STRL REUS W/TWL XL LVL3 (GOWN DISPOSABLE) ×6 IMPLANT
GUIDE AIMING 1.5MM (WIRE) ×2 IMPLANT
NDL HYPO 25X1 1.5 SAFETY (NEEDLE) IMPLANT
NEEDLE HYPO 25X1 1.5 SAFETY (NEEDLE) IMPLANT
NS IRRIG 1000ML POUR BTL (IV SOLUTION) ×3 IMPLANT
PACK BASIN DAY SURGERY FS (CUSTOM PROCEDURE TRAY) ×3 IMPLANT
PADDING CAST ABS 4INX4YD NS (CAST SUPPLIES) ×2
PADDING CAST ABS COTTON 4X4 ST (CAST SUPPLIES) IMPLANT
PENCIL SMOKE EVACUATOR (MISCELLANEOUS) ×3 IMPLANT
SKELETAL DYNAMICS DVR SET (Set) ×2 IMPLANT
SLEEVE SCD COMPRESS KNEE MED (MISCELLANEOUS) ×3 IMPLANT
SLING ARM FOAM STRAP LRG (SOFTGOODS) ×2 IMPLANT
SPLINT PLASTER CAST XFAST 3X15 (CAST SUPPLIES) IMPLANT
SPLINT PLASTER XTRA FASTSET 3X (CAST SUPPLIES) ×20
STOCKINETTE 6  STRL (DRAPES) ×3
STOCKINETTE 6 STRL (DRAPES) ×1 IMPLANT
STRIP CLOSURE SKIN 1/2X4 (GAUZE/BANDAGES/DRESSINGS) ×1 IMPLANT
SUCTION FRAZIER HANDLE 10FR (MISCELLANEOUS) ×3
SUCTION TUBE FRAZIER 10FR DISP (MISCELLANEOUS) ×1 IMPLANT
SUT VIC AB 2-0 PS2 27 (SUTURE) ×3 IMPLANT
SUT VICRYL 4-0 PS2 18IN ABS (SUTURE) IMPLANT
SUT VICRYL RAPIDE 4-0 (SUTURE) IMPLANT
SUT VICRYL RAPIDE 4/0 PS 2 (SUTURE) ×3 IMPLANT
SYR 10ML LL (SYRINGE) IMPLANT
SYR BULB EAR ULCER 3OZ GRN STR (SYRINGE) ×3 IMPLANT
TOWEL GREEN STERILE FF (TOWEL DISPOSABLE) ×3 IMPLANT
TUBE CONNECTING 20'X1/4 (TUBING) ×1
TUBE CONNECTING 20X1/4 (TUBING) ×2 IMPLANT
UNDERPAD 30X36 HEAVY ABSORB (UNDERPADS AND DIAPERS) ×3 IMPLANT
WIRE FIX 1.5 STANDARD TIP (WIRE)
WIRE FIX 1.5 STD TIP (WIRE) IMPLANT

## 2020-03-20 NOTE — Discharge Instructions (Signed)
Discharge Instructions   You have a dressing with a plaster splint incorporated in it. Move your fingers as much as possible, making a full fist and fully opening the fist. Elevate your hand to reduce pain & swelling of the digits.  Ice over the operative site may be helpful to reduce pain & swelling.  DO NOT USE HEAT. Pain medicine has been prescribed for you.  Continue with the current pain management schedule of Tylenol and Ibuprofen. Utilize the Oxycodone as a rescue medicine. Leave the dressing in place until you return to our office.  You may shower, but keep the bandage clean & dry.  You may drive a car when you are off of prescription pain medications and can safely control your vehicle with both hands. Our office will call you to arrange follow-up    Please call [phone] during normal business hours or [phone] after hours for any problems. Including the following:  - excessive redness of the incisions - drainage for more than 4 days - fever of more than 101.5 F  *Please note that pain medications will not be refilled after hours or on weekends.   Post Anesthesia Home Care Instructions  Activity: Get plenty of rest for the remainder of the day. A responsible individual must stay with you for 24 hours following the procedure.  For the next 24 hours, DO NOT: -Drive a car -Advertising copywriter -Drink alcoholic beverages -Take any medication unless instructed by your physician -Make any legal decisions or sign important papers.  Meals: Start with liquid foods such as gelatin or soup. Progress to regular foods as tolerated. Avoid greasy, spicy, heavy foods. If nausea and/or vomiting occur, drink only clear liquids until the nausea and/or vomiting subsides. Call your physician if vomiting continues.  Special Instructions/Symptoms: Your throat may feel dry or sore from the anesthesia or the breathing tube placed in your throat during surgery. If this causes discomfort,  gargle with warm salt water. The discomfort should disappear within 24 hours.  If you had a scopolamine patch placed behind your ear for the management of post- operative nausea and/or vomiting:  1. The medication in the patch is effective for 72 hours, after which it should be removed.  Wrap patch in a tissue and discard in the trash. Wash hands thoroughly with soap and water. 2. You may remove the patch earlier than 72 hours if you experience unpleasant side effects which may include dry mouth, dizziness or visual disturbances. 3. Avoid touching the patch. Wash your hands with soap and water after contact with the patch.  Regional Anesthesia Blocks  1. Numbness or the inability to move the "blocked" extremity may last from 3-48 hours after placement. The length of time depends on the medication injected and your individual response to the medication. If the numbness is not going away after 48 hours, call your surgeon.  2. The extremity that is blocked will need to be protected until the numbness is gone and the  Strength has returned. Because you cannot feel it, you will need to take extra care to avoid injury. Because it may be weak, you may have difficulty moving it or using it. You may not know what position it is in without looking at it while the block is in effect.  3. For blocks in the legs and feet, returning to weight bearing and walking needs to be done carefully. You will need to wait until the numbness is entirely gone and the strength has returned. You  should be able to move your leg and foot normally before you try and bear weight or walk. You will need someone to be with you when you first try to ensure you do not fall and possibly risk injury.  4. Bruising and tenderness at the needle site are common side effects and will resolve in a few days.  5. Persistent numbness or new problems with movement should be communicated to the surgeon or the [facility]  [phone] [facility] [phone]).

## 2020-03-20 NOTE — Anesthesia Procedure Notes (Signed)
Procedure Name: LMA Insertion Date/Time: 03/20/2020 3:22 PM Performed by: Ronnette Hila, CRNA Pre-anesthesia Checklist: Patient identified, Emergency Drugs available, Suction available and Patient being monitored Patient Re-evaluated:Patient Re-evaluated prior to induction Oxygen Delivery Method: Circle system utilized Preoxygenation: Pre-oxygenation with 100% oxygen Induction Type: IV induction Ventilation: Mask ventilation without difficulty LMA: LMA inserted LMA Size: 4.0 Number of attempts: 1 Airway Equipment and Method: Bite block Placement Confirmation: positive ETCO2 Tube secured with: Tape Dental Injury: Teeth and Oropharynx as per pre-operative assessment

## 2020-03-20 NOTE — Transfer of Care (Signed)
Immediate Anesthesia Transfer of Care Note  Patient: [name] Brady  Procedure(s) Performed: OPEN REDUCTION INTERNAL FIXATION (ORIF) RIGHT DISTAL RADIAL FRACTURE (Right Wrist)  Patient Location: PACU  Anesthesia Type:GA combined with regional for post-op pain  Level of Consciousness: awake, alert  and oriented  Airway & Oxygen Therapy: Patient Spontanous Breathing and Patient connected to nasal cannula oxygen  Post-op Assessment: Report given to RN and Post -op Vital signs reviewed and stable  Post vital signs: Reviewed and stable  Last Vitals:  Vitals Value Taken Time  BP 169/97 03/20/20 1631  Temp    Pulse    Resp    SpO2    Vitals shown include unvalidated device data.  Last Pain:  Vitals:   03/20/20 1245  TempSrc: Oral  PainSc: 4       Patients Stated Pain Goal: 4 (03/20/20 1245)  Complications: No complications documented.

## 2020-03-20 NOTE — Anesthesia Preprocedure Evaluation (Addendum)
Anesthesia Evaluation  Patient identified by MRN, date of birth, ID band Patient awake    Reviewed: Allergy & Precautions, NPO status , Patient's Chart, lab work & pertinent test results  Airway Mallampati: II  TM Distance: >3 FB Neck ROM: Full    Dental no notable dental hx. (+) Dental Advisory Given   Pulmonary former smoker,    Pulmonary exam normal breath sounds clear to auscultation       Cardiovascular hypertension, Pt. on home beta blockers Normal cardiovascular exam Rhythm:Regular Rate:Normal     Neuro/Psych PSYCHIATRIC DISORDERS Anxiety Depression Bipolar Disorder negative neurological ROS     GI/Hepatic negative GI ROS, (+)     substance abuse  alcohol use, cocaine use and marijuana use,   Endo/Other  negative endocrine ROS  Renal/GU negative Renal ROS  negative genitourinary   Musculoskeletal negative musculoskeletal ROS (+)   Abdominal   Peds negative pediatric ROS (+)  Hematology negative hematology ROS (+) anemia ,   Anesthesia Other Findings   Reproductive/Obstetrics negative OB ROS                            Anesthesia Physical  Anesthesia Plan  ASA: II  Anesthesia Plan: Regional and General   Post-op Pain Management:    Induction:   PONV Risk Score and Plan: 2 and Ondansetron  Airway Management Planned: LMA  Additional Equipment:   Intra-op Plan:   Post-operative Plan: Extubation in OR  Informed Consent: I have reviewed the patients History and Physical, chart, labs and discussed the procedure including the risks, benefits and alternatives for the proposed anesthesia with the patient or authorized representative who has indicated his/her understanding and acceptance.     Dental advisory given  Plan Discussed with: CRNA and Anesthesiologist  Anesthesia Plan Comments:         Anesthesia Quick Evaluation

## 2020-03-20 NOTE — Anesthesia Postprocedure Evaluation (Signed)
Anesthesia Post Note  Patient: [name] Brady  Procedure(s) Performed: OPEN REDUCTION INTERNAL FIXATION (ORIF) RIGHT DISTAL RADIAL FRACTURE (Right Wrist)     Patient location during evaluation: PACU Anesthesia Type: Regional and General Level of consciousness: awake Pain management: pain level controlled Vital Signs Assessment: post-procedure vital signs reviewed and stable Respiratory status: spontaneous breathing and respiratory function stable Cardiovascular status: stable Postop Assessment: no apparent nausea or vomiting Anesthetic complications: no   No complications documented.  Last Vitals:  Vitals:   03/20/20 1630 03/20/20 1645  BP: (!) 169/97 (!) 171/90  Pulse: 72 70  Resp: 16 18  Temp: 36.6 C   SpO2: 98% 99%    Last Pain:  Vitals:   03/20/20 1645  TempSrc:   PainSc: 0-No pain                 Mellody Dance

## 2020-03-20 NOTE — Interval H&P Note (Signed)
History and Physical Interval Note:  03/20/2020 3:10 PM  [name] Brady  has presented today for surgery, with the diagnosis of RIGHT DISTAL RADIUS FRACTURE.  He has already undergone operative care for his L DRFx earlier this week.  The various methods of treatment have been discussed with the patient and family. After consideration of risks, benefits and other options for treatment, the patient has consented to  Procedure(s) with comments: OPEN REDUCTION INTERNAL FIXATION (ORIF) RIGHT DISTAL RADIAL FRACTURE (Right) - LENGTH OF SURGERY: 75 MIN  PRE OP BLOCK as a surgical intervention.  The patient's history has been reviewed, patient examined, no change in status, stable for surgery.  I have reviewed the patient's chart and labs.  Questions were answered to the patient's satisfaction.     [name]

## 2020-03-20 NOTE — Progress Notes (Signed)
Assisted Dr. Bass with right, ultrasound guided, supraclavicular block. Side rails up, monitors on throughout procedure. See vital signs in flow sheet. Tolerated Procedure well. ?

## 2020-03-20 NOTE — Op Note (Signed)
03/20/2020  3:11 PM  PATIENT:  [name] Brady  44 y.o. male  PRE-OPERATIVE DIAGNOSIS: Displaced right extra-articular distal radius fracture  POST-OPERATIVE DIAGNOSIS:  Same  PROCEDURE: Open treatment displaced right extra-articular distal radius fracture  SURGEON: Cliffton Asters. Janee Morn, MD  PHYSICIAN ASSISTANT: Danielle Rankin, OPA-C  ANESTHESIA:  regional and general  SPECIMENS:  None  DRAINS: None  EBL:  less than 50 mL  PREOPERATIVE INDICATIONS:  [name] Brady is a  44 y.o. male with a displaced right extra-articular distal radius fracture  The risks benefits and alternatives were discussed with the patient preoperatively including but not limited to the risks of infection, bleeding, nerve injury, cardiopulmonary complications, the need for revision surgery, among others, and the patient verbalized understanding and consented to proceed.  OPERATIVE IMPLANTS: Skeletal dynamics Geminus plate/screws/pegs  OPERATIVE PROCEDURE: After receiving prophylactic antibiotics and a regional block, the patient was escorted to the operative theatre and placed in a supine position.  General anesthesia was administered.  A surgical "time-out" was performed during which the planned procedure, proposed operative site, and the correct patient identity were compared to the operative consent and agreement confirmed by the circulating nurse according to current facility policy. Following application of a tourniquet to the operative extremity, the exposed skin was pre-scrubbed with Hibiclens scrub brush and then was prepped with Chloraprep and draped in the usual sterile fashion. The limb was exsanguinated with an Esmarch bandage and the tourniquet inflated to approximately higher than systolic BP.   A sinusoidal-shaped incision was marked and made over the FCR axis and the distal forearm. The skin was incised sharply with scalpel, subcutaneous tissues with blunt and spreading dissection. The FCR axis  was exploited deeply. The pronator quadratus was reflected in an L-shaped ulnarly . The fracture was inspected and provisionally reduced.  This was confirmed fluoroscopically. The appropriately sized plate was selected and found to fit well. It was placed in its provisional alignment of the radius and this was confirmed fluoroscopically.  It was secured to the radius with a screw through the slotted hole.  Additional adjustments were made as necessary, and the distal holes were all drilled and filled.  Peg/screw length distally was selected on the shorter side of measurements to minimize the risk for dorsal cortical penetration. The remainder of the proximal holes were drilled and filled.   Final images were obtained and the DRUJ was examined for stability. It was found to be sufficiently stable. The wound was then copiously irrigated and the PQ repaired with 2-0 Vicryl.  Tourniquet was released and additional hemostasis obtained and the skin was closed with 2-0 Vicryl deep dermal buried sutures followed by running 4-0 Vicryl Rapide horizontal mattress suture in the part of the wound, buried subcuticular interuppted and steri strips in part due to body art. A bulky dressing with a volar plaster component was applied and the patient was taken to the recovery room in stable condition.  DISPOSITION: The patient will be discharged home today with typical post-op instructions, returning in 10-15 days for reevaluation with new x-rays of the affected both wrists t out of the splint to include an inclined laterals and then transition to therapy to have a custom splint constructed for each wrist and begin rehabilitation.

## 2020-03-23 ENCOUNTER — Encounter (HOSPITAL_BASED_OUTPATIENT_CLINIC_OR_DEPARTMENT_OTHER): Payer: Self-pay | Admitting: Orthopedic Surgery

## 2020-03-23 MED ORDER — ROPIVACAINE HCL 5 MG/ML IJ SOLN
INTRAMUSCULAR | Status: DC | PRN
  Administered 2020-03-20: 30 mL via EPIDURAL

## 2020-03-23 NOTE — Anesthesia Procedure Notes (Signed)
Anesthesia Regional Block: Supraclavicular block   Pre-Anesthetic Checklist: ,, timeout performed, Correct Patient, Correct Site, Correct Laterality, Correct Procedure, Correct Position, site marked, Risks and benefits discussed,  Surgical consent,  Pre-op evaluation,  At surgeon's request and post-op pain management  Laterality: Right  Prep: chloraprep       Needles:  Injection technique: Single-shot  Needle Type: Echogenic Stimulator Needle     Needle Length: 10cm  Needle Gauge: 20     Additional Needles:   Procedures:,,,, ultrasound used (permanent image in chart),,,,  Narrative:  Start time: 03/19/2020 2:20 PM End time: 03/19/2020 2:30 PM Injection made incrementally with aspirations every 5 mL.  Performed by: Personally  Anesthesiologist: Mellody Dance, MD  Additional Notes: Functioning IV was confirmed and monitors applied. Sterile prep and drape,hand hygiene and sterile gloves were used.Ultrasound guidance: relevant anatomy identified, needle position confirmed, local anesthetic spread visualized around nerve(s)., vascular puncture avoided.  Image printed for medical record.  Negative aspiration and negative test dose prior to incremental administration of local anesthetic. The patient tolerated the procedure well.

## 2020-03-23 NOTE — Addendum Note (Signed)
Addendum  created 03/23/20 1403 by Mellody Dance, MD   Attestation recorded in Intraprocedure, Child order released for a procedure order, Clinical Note Signed, Flowsheet accepted, Intraprocedure Attestations filed, Scientist, water quality edited

## 2020-03-25 NOTE — ED Provider Notes (Signed)
ADDENDUM: I was requested by [employer] to document the intraoperative procedure and sedating time for wrist reduction during pt's visit on 03/07/2020.  This will need to be document by Dr. Rhunette Croft.  BP (!) 190/84   Pulse 82   Temp 97.9 F (36.6 C) (Oral)   Resp 13   Ht 5\' 11"  (1.803 m)   Wt 75.8 kg   SpO2 97%   BMI 23.29 kg/m   Results for orders placed or performed during the hospital encounter of 03/07/20  Basic metabolic panel  Result Value Ref Range   Sodium 139 135 - 145 mmol/L   Potassium 3.8 3.5 - 5.1 mmol/L   Chloride 104 98 - 111 mmol/L   CO2 22 22 - 32 mmol/L   Glucose, Bld 90 70 - 99 mg/dL   BUN 19 6 - 20 mg/dL   Creatinine, Ser 03/09/20 0.61 - 1.24 mg/dL   Calcium 8.0 (L) 8.9 - 10.3 mg/dL   GFR, Estimated 2.67 >12 mL/min   Anion gap 13 5 - 15  CBC with Differential  Result Value Ref Range   WBC 11.1 (H) 4.0 - 10.5 K/uL   RBC 3.51 (L) 4.22 - 5.81 MIL/uL   Hemoglobin 12.6 (L) 13.0 - 17.0 g/dL   HCT >45 (L) 39 - 52 %   MCV 102.6 (H) 80.0 - 100.0 fL   MCH 35.9 (H) 26.0 - 34.0 pg   MCHC 35.0 30.0 - 36.0 g/dL   RDW 80.9 98.3 - 38.2 %   Platelets 189 150 - 400 K/uL   nRBC 0.0 0.0 - 0.2 %   Neutrophils Relative % 87 %   Neutro Abs 9.6 (H) 1.7 - 7.7 K/uL   Lymphocytes Relative 7 %   Lymphs Abs 0.7 0.7 - 4.0 K/uL   Monocytes Relative 6 %   Monocytes Absolute 0.6 0.1 - 1.0 K/uL   Eosinophils Relative 0 %   Eosinophils Absolute 0.0 0.0 - 0.5 K/uL   Basophils Relative 0 %   Basophils Absolute 0.0 0.0 - 0.1 K/uL   Immature Granulocytes 0 %   Abs Immature Granulocytes 0.03 0.00 - 0.07 K/uL   DG Forearm Left  Result Date: 03/07/2020 CLINICAL DATA:  Post reduction EXAM: LEFT FOREARM - 2 VIEW COMPARISON:  Radiograph same day FINDINGS: There is interval reduction of the comminuted impacted intra-articular distal radius fracture with slight dorsal angulation. Overlying soft tissue swelling is seen. IMPRESSION: Improved alignment of the comminuted intra-articular distal radius  fracture with slight dorsal angulation. Electronically Signed   By: 03/09/2020 M.D.   On: 03/07/2020 19:11   DG Forearm Left  Result Date: 03/07/2020 CLINICAL DATA:  Pain after trauma EXAM: LEFT FOREARM - 2 VIEW COMPARISON:  None. FINDINGS: There is a comminuted angulated fracture of the distal radial metaphysis. The fracture appears to extend into the radiocarpal joint. The carpal bones are grossly intact on limited imaging. IMPRESSION: Comminuted angulated fracture through the distal radius extending into the radiocarpal joint. Electronically Signed   By: 03/09/2020 III M.D   On: 03/07/2020 17:13   DG Forearm Right  Result Date: 03/21/2020 CLINICAL DATA:  Three intraoperative views obtained during ORIF of distal radius fracture. FLUOROSCOPY TIME:  34 seconds. Images: 3 EXAM: RIGHT FOREARM - 2 VIEW COMPARISON:  None. FINDINGS: A plate is affixed across the distal radius fracture with multiple screws. Hardware is in good position. IMPRESSION: ORIF of distal radius fracture. Electronically Signed   By: 14/07/2019 III M.D   On:  03/21/2020 18:03   DG Forearm Right  Result Date: 03/07/2020 CLINICAL DATA:  Postreduction EXAM: RIGHT FOREARM - 2 VIEW COMPARISON:  None. FINDINGS: There is interval reduction of the comminuted impacted distal radius fracture with improved alignment. There is a mildly displaced ulnar styloid fracture. Overlying soft tissue swelling is seen. IMPRESSION: Interval reduction of the comminuted impacted distal radius fracture. Electronically Signed   By: Jonna Clark M.D.   On: 03/07/2020 19:12   DG Forearm Right  Result Date: 03/07/2020 CLINICAL DATA:  Pain after fall EXAM: RIGHT FOREARM - 2 VIEW COMPARISON:  None. FINDINGS: There is a comminuted displaced fracture with angulation through the distal radius. IMPRESSION: There is a comminuted displaced angulated fracture through the distal radius. Electronically Signed   By: Gerome Sam III M.D   On: 03/07/2020  17:15   DG Wrist 2 Views Left  Result Date: 03/17/2020 CLINICAL DATA:  Known distal radial fracture EXAM: LEFT WRIST - 2 VIEW; DG C-ARM 1-60 MIN COMPARISON:  03/07/2020 FLUOROSCOPY TIME:  Fluoroscopy Time:  31 seconds Radiation Exposure Index (if provided by the fluoroscopic device): 0.6 mGy Number of Acquired Spot Images: 4 FINDINGS: Fixation sideplate is noted along the distal radius. Multiple fixation screws are seen. Fracture fragments are in near anatomic alignment. IMPRESSION: ORIF of distal radial fracture on the left. Electronically Signed   By: Alcide Clever M.D.   On: 03/17/2020 00:03   CT HEAD WO CONTRAST  Result Date: 03/07/2020 CLINICAL DATA:  Posterior neck pain, dizziness.  Fall EXAM: CT HEAD WITHOUT CONTRAST CT CERVICAL SPINE WITHOUT CONTRAST TECHNIQUE: Multidetector CT imaging of the head and cervical spine was performed following the standard protocol without intravenous contrast. Multiplanar CT image reconstructions of the cervical spine were also generated. COMPARISON:  08/31/2004 FINDINGS: CT HEAD FINDINGS Brain: No evidence of acute infarction, hemorrhage, hydrocephalus, extra-axial collection or mass lesion/mass effect. Vascular: No hyperdense vessel or unexpected calcification. Skull: Normal. Negative for fracture or focal lesion. Sinuses/Orbits: No acute finding. Other: None. CT CERVICAL SPINE FINDINGS Alignment: Facet joints are aligned without dislocation or traumatic listhesis. Dens and lateral masses are aligned. Straightening of the cervical lordosis. Skull base and vertebrae: No acute fracture. No primary bone lesion or focal pathologic process. Soft tissues and spinal canal: No prevertebral fluid or swelling. No visible canal hematoma. Disc levels: Disc height loss and endplate spurring most pronounced at C4-5 through C6-7 where there is at least mild canal stenosis at these levels. Minimal facet arthropathy. Upper chest: Visualized lung apices are clear. Other: None.  IMPRESSION: 1. No acute intracranial findings. 2. No evidence of acute fracture or traumatic listhesis of the cervical spine. 3. 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 Electronically Signed   By: Duanne Guess D.O.   On: 03/07/2020 17:57   CT CERVICAL SPINE WO CONTRAST  Result Date: 03/07/2020 CLINICAL DATA:  Posterior neck pain, dizziness.  Fall EXAM: CT HEAD WITHOUT CONTRAST CT CERVICAL SPINE WITHOUT CONTRAST TECHNIQUE: Multidetector CT imaging of the head and cervical spine was performed following the standard protocol without intravenous contrast. Multiplanar CT image reconstructions of the cervical spine were also generated. COMPARISON:  08/31/2004 FINDINGS: CT HEAD FINDINGS Brain: No evidence of acute infarction, hemorrhage, hydrocephalus, extra-axial collection or mass lesion/mass effect. Vascular: No hyperdense vessel or unexpected calcification. Skull: Normal. Negative for fracture or focal lesion. Sinuses/Orbits: No acute finding. Other: None.  CT CERVICAL SPINE FINDINGS Alignment: Facet joints are aligned without dislocation or traumatic listhesis. Dens and lateral masses are aligned. Straightening of the cervical lordosis. Skull base and vertebrae: No acute fracture. No primary bone lesion or focal pathologic process. Soft tissues and spinal canal: No prevertebral fluid or swelling. No visible canal hematoma. Disc levels: Disc height loss and endplate spurring most pronounced at C4-5 through C6-7 where there is at least mild canal stenosis at these levels. Minimal facet arthropathy. Upper chest: Visualized lung apices are clear. Other: None. IMPRESSION: 1. No acute intracranial findings. 2. No evidence of acute fracture or traumatic listhesis of the cervical spine. 3. 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 Electronically Signed   By: Duanne Guess D.O.   On: 03/07/2020 17:57   DG C-Arm 1-60 Min  Result Date: 03/21/2020 CLINICAL DATA:  Fluoroscopy obtained during ORIF of right distal radius fracture. EXAM: DG C-ARM 1-60 MIN FLUOROSCOPY TIME:  Fluoroscopy Time:  34 seconds Radiation Exposure Index (if provided by the fluoroscopic device): 1.43 mGy Number of Acquired Spot Images: 3 COMPARISON:  None. FINDINGS: A plate is affixed across the known distal radius fracture with multiple screws, in good position. IMPRESSION: ORIF of distal radius fracture as above. Electronically Signed   By: Gerome Sam III M.D   On: 03/21/2020 18:04   DG C-Arm 1-60 Min  Result Date: 03/17/2020 CLINICAL DATA:  Known distal radial fracture EXAM: LEFT WRIST - 2 VIEW; DG C-ARM 1-60 MIN COMPARISON:  03/07/2020 FLUOROSCOPY TIME:  Fluoroscopy Time:  31 seconds Radiation Exposure Index (if provided by the fluoroscopic device): 0.6 mGy Number of Acquired Spot Images: 4 FINDINGS: Fixation sideplate is noted along the distal radius. Multiple fixation screws are seen. Fracture fragments are in near anatomic alignment. IMPRESSION: ORIF of distal radial fracture on the left. Electronically Signed   By: Alcide Clever M.D.   On: 03/17/2020 00:03      Fayrene Helper, PA-C 03/25/20 1105    Derwood Kaplan, MD 03/31/20 1035

## 2022-05-20 IMAGING — CT CT HEAD W/O CM
4 of 7 series · 16 of 47 positions shown, 17 images · non-contrast
Comparison: 08/31/2004

CLINICAL DATA: Posterior neck pain, dizziness.  Fall

EXAM:
CT HEAD WITHOUT CONTRAST
CT CERVICAL SPINE WITHOUT CONTRAST
TECHNIQUE: Multidetector CT imaging of the head and cervical spine was
performed following the standard protocol without intravenous
contrast. Multiplanar CT image reconstructions of the cervical spine
were also generated.

[Series 3: head wo · axial · 0.47mm/px · z∈[+1698,+1778]mm · 3 of 33 slices shown, 4 images]
[im 9/33  brain]
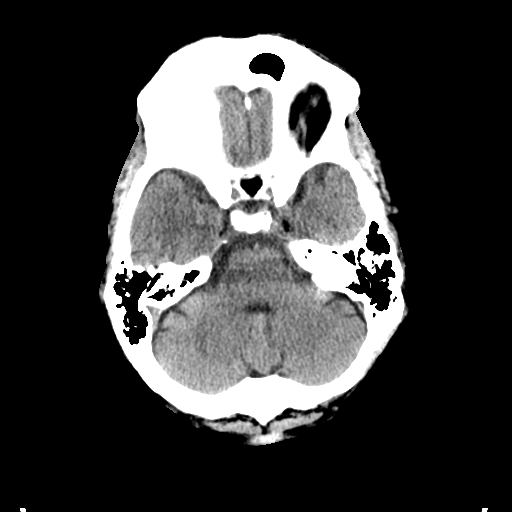
[im 9/33  bone]
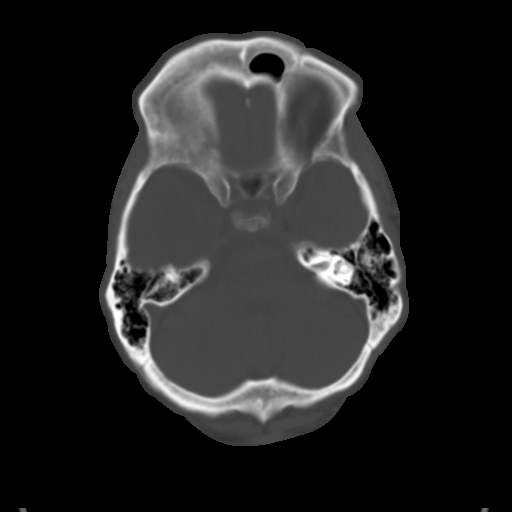
[im 17/33  brain]
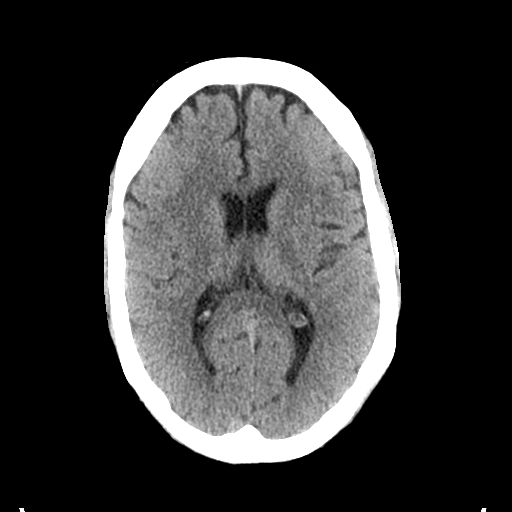
[im 25/33  brain]
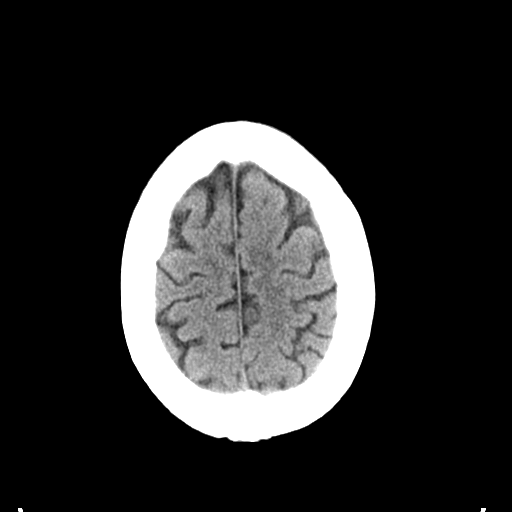

[Series 6: coronal soft tissue · coronal · 0.36mm/px · 3 of 83 slices shown]
[im 4/83  brain]
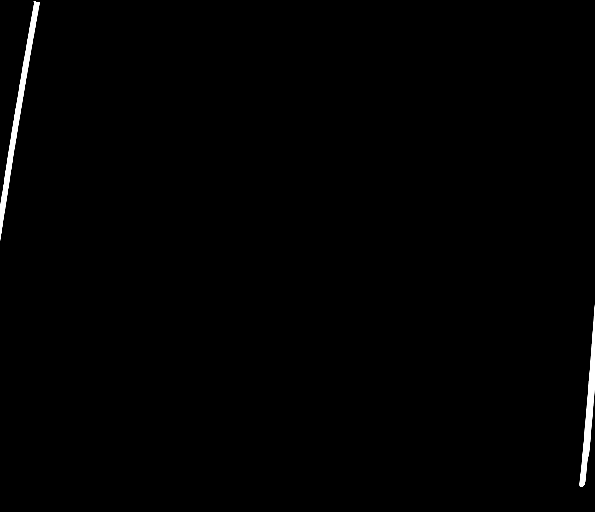
[im 7/83  brain]
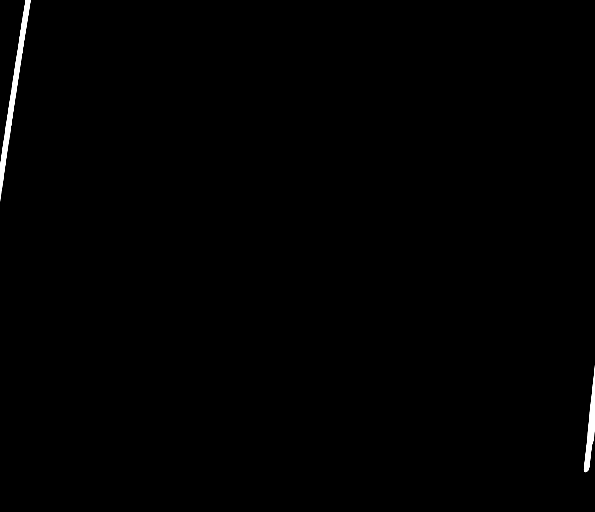
[im 10/83  brain]
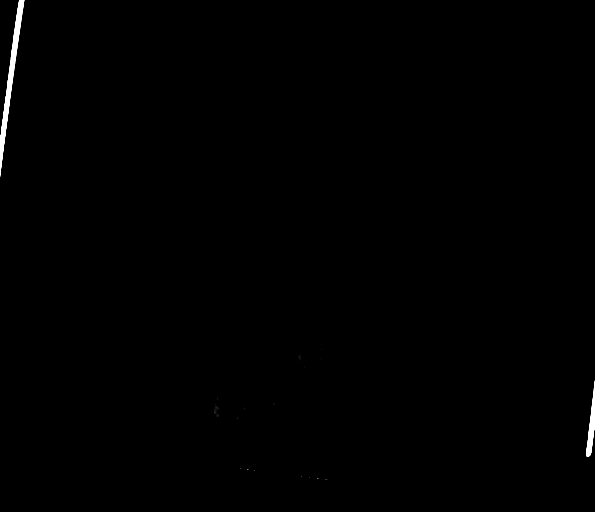

[Series 7: sagittal soft tissue · sagittal · 0.36mm/px · 2 of 72 slices shown]
[im 24/72  brain]
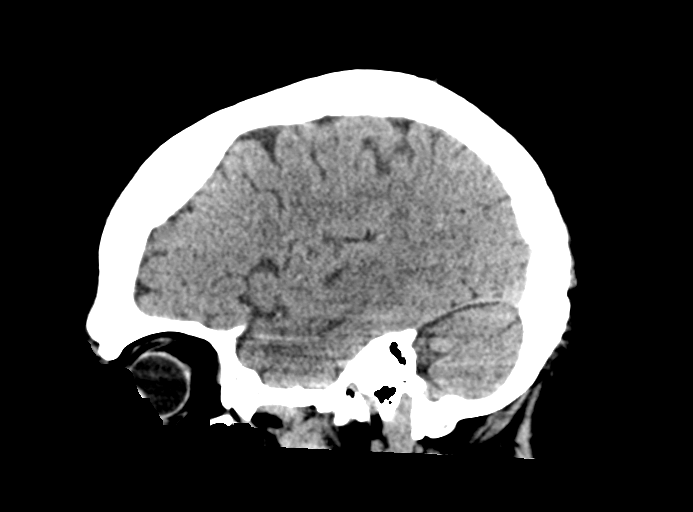
[im 48/72  brain]
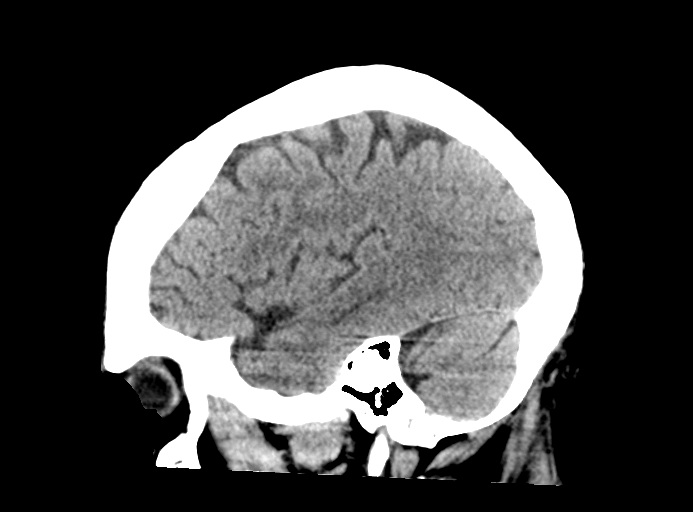

[Series 11: orthogonal bone · axial · 0.26mm/px · z∈[+1470,+1625]mm · 8 of 105 slices shown]
[im 9/105  bone]
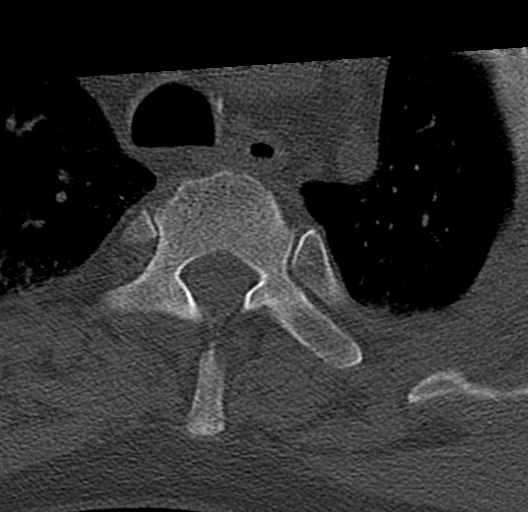
[im 27/105  bone]
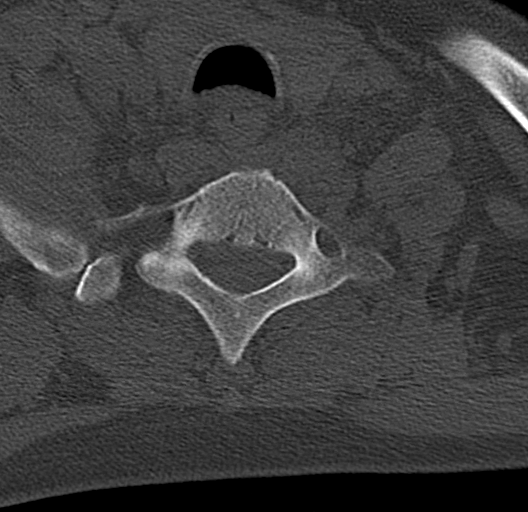
[im 35/105  bone]
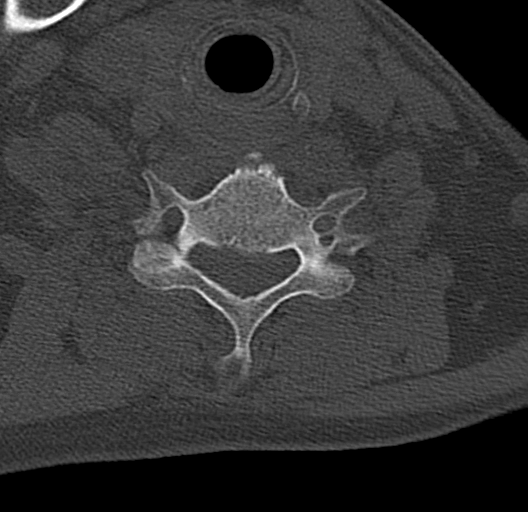
[im 44/105  bone]
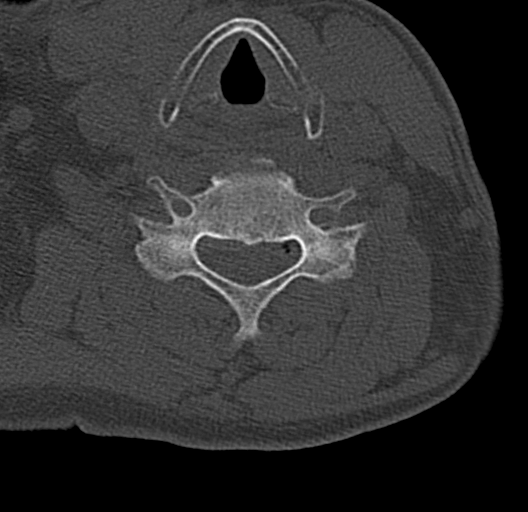
[im 61/105  bone]
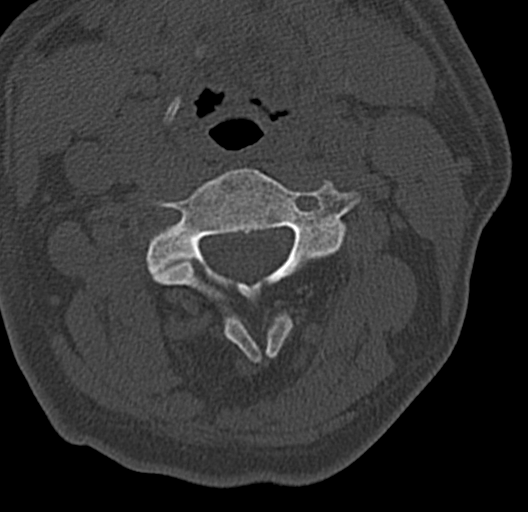
[im 70/105  bone]
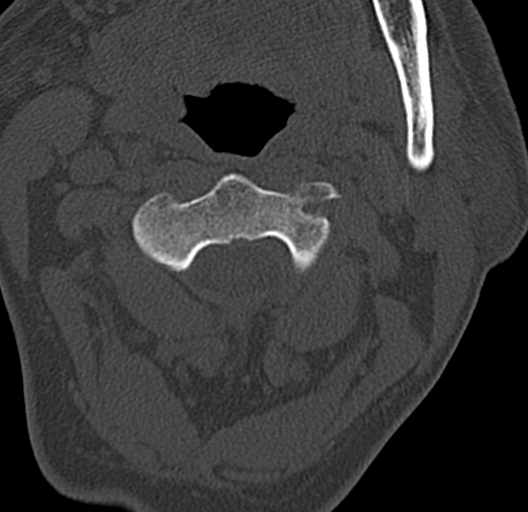
[im 79/105  bone]
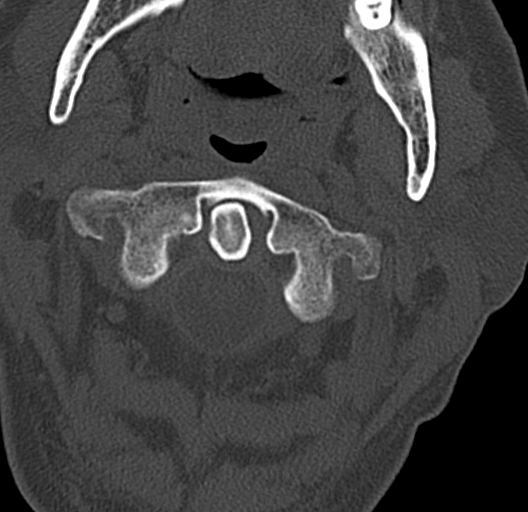
[im 96/105  bone]
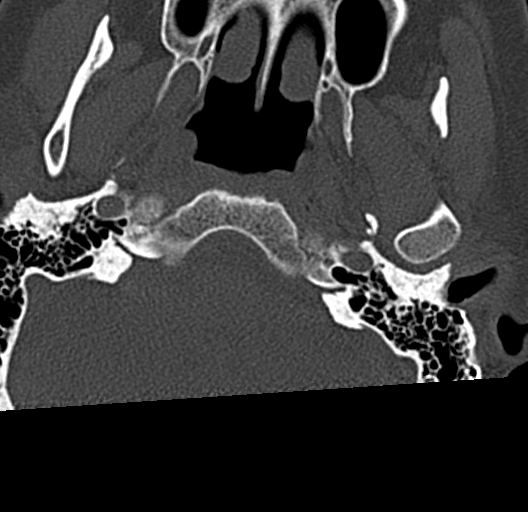

[16 of 47 positions shown; findings below may reference images not displayed]

FINDINGS: CT HEAD FINDINGS

Brain: No evidence of acute infarction, hemorrhage, hydrocephalus,
extra-axial collection or mass lesion/mass effect.

Vascular: No hyperdense vessel or unexpected calcification.

Skull: Normal. Negative for fracture or focal lesion.

Sinuses/Orbits: No acute finding.

Other: None.

CT CERVICAL SPINE FINDINGS

Alignment: Facet joints are aligned without dislocation or traumatic
listhesis. Dens and lateral masses are aligned. Straightening of the
cervical lordosis.

Skull base and vertebrae: No acute fracture. No primary bone lesion
or focal pathologic process.

Soft tissues and spinal canal: No prevertebral fluid or swelling. No
visible canal hematoma.

Disc levels: Disc height loss and endplate spurring most pronounced
at C4-5 through C6-7 where there is at least mild canal stenosis at
these levels. Minimal facet arthropathy.

Upper chest: Visualized lung apices are clear.

Other: None.
IMPRESSION: 1. No acute intracranial findings.
2. No evidence of acute fracture or traumatic listhesis of the
cervical spine.
3. Mild multilevel degenerative disc disease and facet arthropathy
of the cervical spine, most pronounced at C4-5 through C6-7 where
there is at least mild canal stenosis at these levels.

## 2022-06-02 IMAGING — RF DG C-ARM 1-60 MIN
1 series · 3 of 3 positions shown · non-contrast
Comparison: None.

CLINICAL DATA: Fluoroscopy obtained during ORIF of right distal
radius fracture.

EXAM:
DG C-ARM 1-60 MIN
FLUOROSCOPY TIME:  Fluoroscopy Time:  34 seconds
Radiation Exposure Index (if provided by the fluoroscopic device):
1.43 mGy
Number of Acquired Spot Images: 3

[Series 1: run · 3 of 3 slices shown]
[im 1/3]
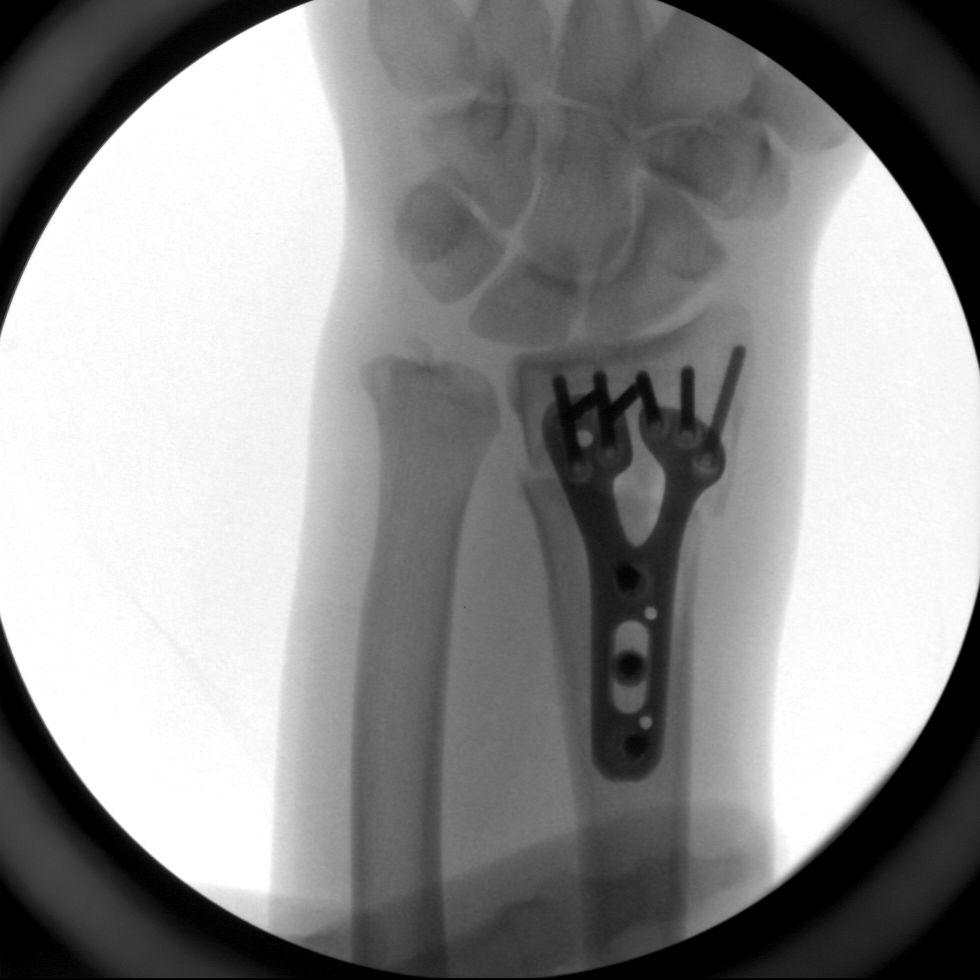
[im 2/3]
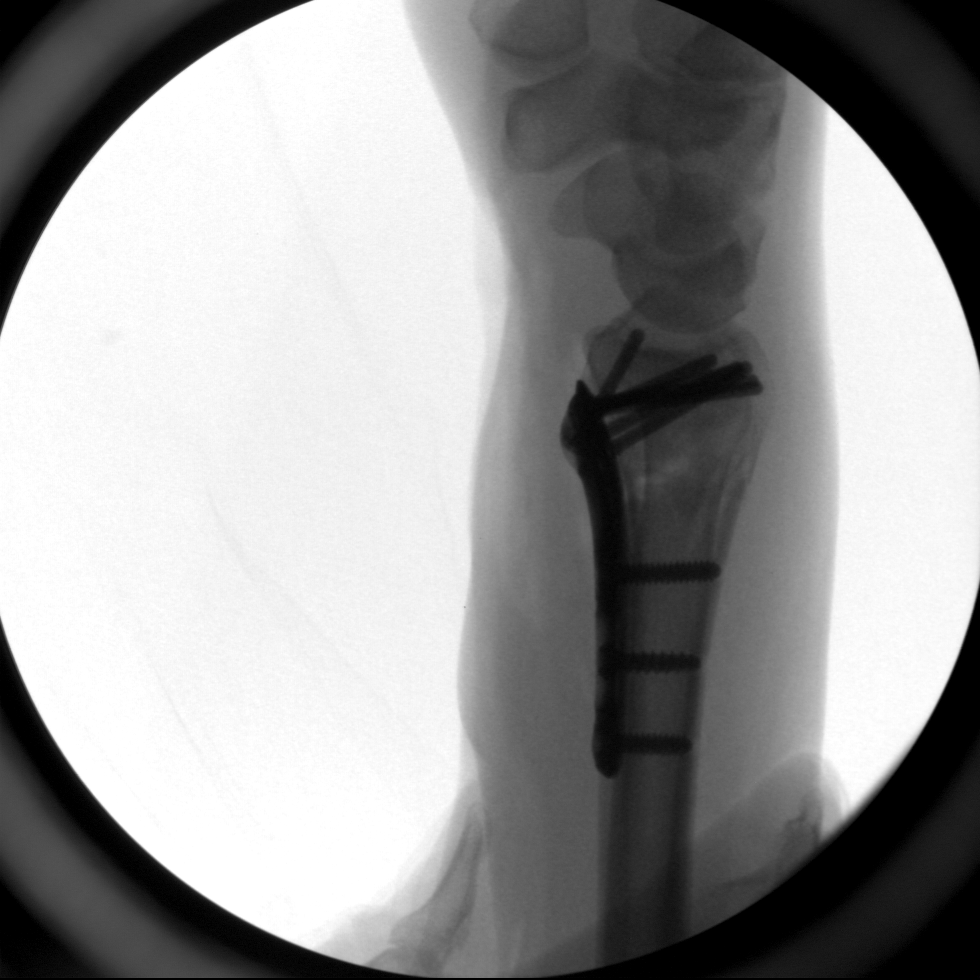
[im 3/3]
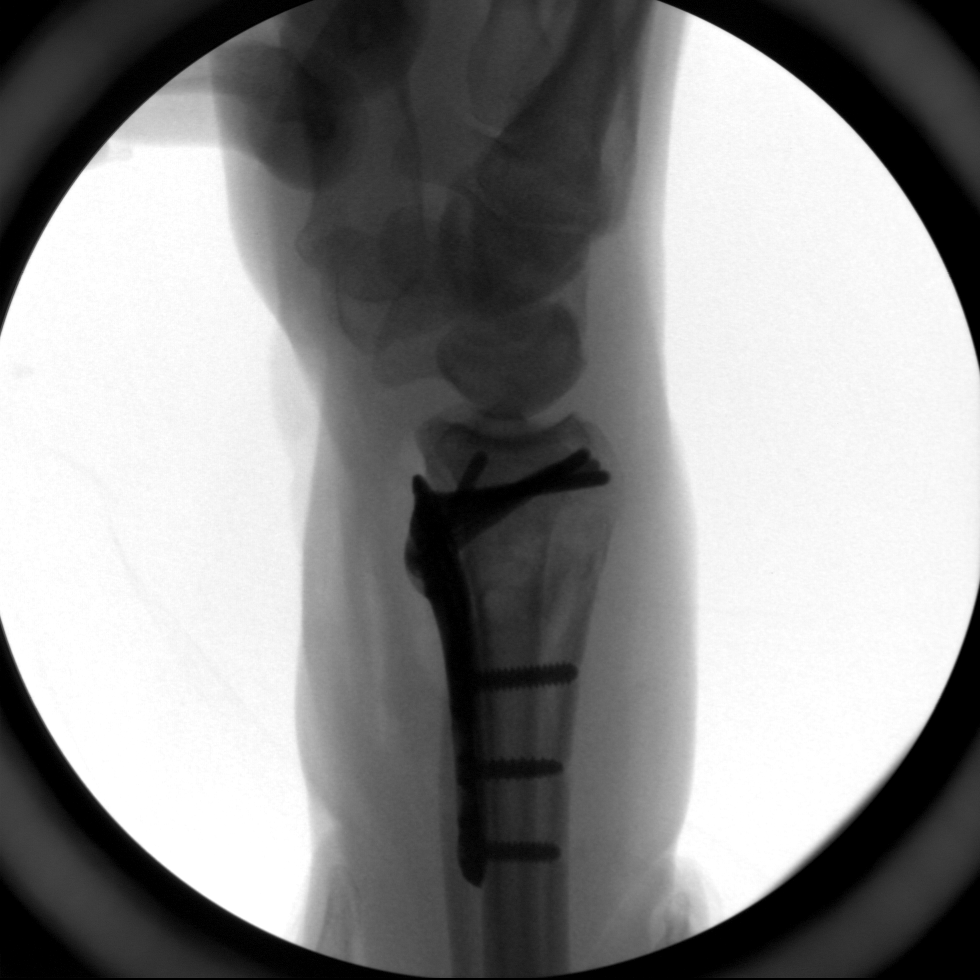

[3 of 3 positions shown; findings below may reference images not displayed]

FINDINGS: A plate is affixed across the known distal radius fracture with
multiple screws, in good position.
IMPRESSION: ORIF of distal radius fracture as above.

## 2023-02-25 DIAGNOSIS — H00012 Hordeolum externum right lower eyelid: Secondary | ICD-10-CM | POA: Diagnosis not present

## 2023-05-16 DIAGNOSIS — L639 Alopecia areata, unspecified: Secondary | ICD-10-CM | POA: Diagnosis not present

## 2023-05-16 DIAGNOSIS — I10 Essential (primary) hypertension: Secondary | ICD-10-CM | POA: Diagnosis not present

## 2023-05-16 DIAGNOSIS — E559 Vitamin D deficiency, unspecified: Secondary | ICD-10-CM | POA: Diagnosis not present

## 2023-05-16 DIAGNOSIS — Z0000 Encounter for general adult medical examination without abnormal findings: Secondary | ICD-10-CM | POA: Diagnosis not present

## 2023-05-16 DIAGNOSIS — N529 Male erectile dysfunction, unspecified: Secondary | ICD-10-CM | POA: Diagnosis not present

## 2023-05-29 DIAGNOSIS — F419 Anxiety disorder, unspecified: Secondary | ICD-10-CM | POA: Diagnosis not present

## 2023-05-29 DIAGNOSIS — F319 Bipolar disorder, unspecified: Secondary | ICD-10-CM | POA: Diagnosis not present

## 2023-08-22 DIAGNOSIS — F419 Anxiety disorder, unspecified: Secondary | ICD-10-CM | POA: Diagnosis not present

## 2023-08-22 DIAGNOSIS — F319 Bipolar disorder, unspecified: Secondary | ICD-10-CM | POA: Diagnosis not present

## 2023-11-08 DIAGNOSIS — I10 Essential (primary) hypertension: Secondary | ICD-10-CM | POA: Diagnosis not present

## 2023-11-08 DIAGNOSIS — M25519 Pain in unspecified shoulder: Secondary | ICD-10-CM | POA: Diagnosis not present

## 2023-12-11 DIAGNOSIS — F419 Anxiety disorder, unspecified: Secondary | ICD-10-CM | POA: Diagnosis not present

## 2023-12-11 DIAGNOSIS — F319 Bipolar disorder, unspecified: Secondary | ICD-10-CM | POA: Diagnosis not present

## 2024-01-08 DIAGNOSIS — F419 Anxiety disorder, unspecified: Secondary | ICD-10-CM | POA: Diagnosis not present

## 2024-01-08 DIAGNOSIS — F319 Bipolar disorder, unspecified: Secondary | ICD-10-CM | POA: Diagnosis not present

## 2024-02-20 DIAGNOSIS — F419 Anxiety disorder, unspecified: Secondary | ICD-10-CM | POA: Diagnosis not present

## 2024-02-20 DIAGNOSIS — F319 Bipolar disorder, unspecified: Secondary | ICD-10-CM | POA: Diagnosis not present
